# Patient Record
Sex: Female | Born: 1970 | Race: White | Hispanic: No | Marital: Married | Smoking: Never smoker
Health system: Southern US, Community
[De-identification: ages and names within clinical notes are randomized; demographics above are authoritative.]

## PROBLEM LIST (undated history)

## (undated) DIAGNOSIS — F988 Other specified behavioral and emotional disorders with onset usually occurring in childhood and adolescence: Secondary | ICD-10-CM

## (undated) DIAGNOSIS — F419 Anxiety disorder, unspecified: Secondary | ICD-10-CM

## (undated) DIAGNOSIS — R112 Nausea with vomiting, unspecified: Secondary | ICD-10-CM

## (undated) DIAGNOSIS — M797 Fibromyalgia: Secondary | ICD-10-CM

## (undated) DIAGNOSIS — T148XXA Other injury of unspecified body region, initial encounter: Secondary | ICD-10-CM

## (undated) DIAGNOSIS — F32A Depression, unspecified: Secondary | ICD-10-CM

## (undated) DIAGNOSIS — F329 Major depressive disorder, single episode, unspecified: Secondary | ICD-10-CM

## (undated) DIAGNOSIS — Z9889 Other specified postprocedural states: Secondary | ICD-10-CM

## (undated) HISTORY — DX: Depression, unspecified: F32.A

## (undated) HISTORY — PX: CHOLECYSTECTOMY: SHX55

## (undated) HISTORY — PX: WISDOM TOOTH EXTRACTION: SHX21

## (undated) HISTORY — DX: Anxiety disorder, unspecified: F41.9

## (undated) HISTORY — DX: Major depressive disorder, single episode, unspecified: F32.9

## (undated) HISTORY — DX: Other specified behavioral and emotional disorders with onset usually occurring in childhood and adolescence: F98.8

## (undated) HISTORY — DX: Other injury of unspecified body region, initial encounter: T14.8XXA

## (undated) HISTORY — DX: Fibromyalgia: M79.7

---

## 2004-05-25 ENCOUNTER — Ambulatory Visit (HOSPITAL_COMMUNITY): Admission: RE | Admit: 2004-05-25 | Discharge: 2004-05-25 | Payer: Self-pay | Admitting: Gynecology

## 2004-09-13 ENCOUNTER — Inpatient Hospital Stay (HOSPITAL_COMMUNITY): Admission: AD | Admit: 2004-09-13 | Discharge: 2004-09-13 | Payer: Self-pay | Admitting: Gynecology

## 2004-10-06 ENCOUNTER — Inpatient Hospital Stay (HOSPITAL_COMMUNITY): Admission: AD | Admit: 2004-10-06 | Discharge: 2004-10-09 | Payer: Self-pay | Admitting: Gynecology

## 2004-10-08 ENCOUNTER — Encounter (INDEPENDENT_AMBULATORY_CARE_PROVIDER_SITE_OTHER): Payer: Self-pay | Admitting: Specialist

## 2006-05-18 ENCOUNTER — Encounter: Payer: Self-pay | Admitting: Obstetrics & Gynecology

## 2006-05-18 ENCOUNTER — Ambulatory Visit: Payer: Self-pay | Admitting: Obstetrics & Gynecology

## 2006-07-03 ENCOUNTER — Ambulatory Visit: Payer: Self-pay | Admitting: Gynecology

## 2006-07-06 ENCOUNTER — Ambulatory Visit: Payer: Self-pay | Admitting: Gynecology

## 2007-08-21 ENCOUNTER — Ambulatory Visit: Payer: Self-pay | Admitting: Orthopedic Surgery

## 2007-12-06 ENCOUNTER — Encounter (INDEPENDENT_AMBULATORY_CARE_PROVIDER_SITE_OTHER): Payer: Self-pay | Admitting: Gynecology

## 2007-12-06 ENCOUNTER — Ambulatory Visit: Payer: Self-pay | Admitting: Gynecology

## 2009-05-04 ENCOUNTER — Ambulatory Visit: Payer: Self-pay | Admitting: Obstetrics and Gynecology

## 2009-05-19 ENCOUNTER — Ambulatory Visit: Payer: Self-pay | Admitting: Obstetrics & Gynecology

## 2009-06-02 ENCOUNTER — Ambulatory Visit: Payer: Self-pay | Admitting: Family Medicine

## 2009-09-23 ENCOUNTER — Ambulatory Visit: Payer: Self-pay | Admitting: Family Medicine

## 2009-09-23 LAB — CONVERTED CEMR LAB
FSH: 4.3 milliintl units/mL
LH: 16.7 milliintl units/mL

## 2009-11-19 ENCOUNTER — Ambulatory Visit (HOSPITAL_COMMUNITY): Admission: RE | Admit: 2009-11-19 | Discharge: 2009-11-19 | Payer: Self-pay | Admitting: Family Medicine

## 2009-11-19 ENCOUNTER — Ambulatory Visit: Payer: Self-pay | Admitting: Family Medicine

## 2009-12-01 ENCOUNTER — Ambulatory Visit: Payer: Self-pay | Admitting: Family Medicine

## 2010-03-14 HISTORY — PX: TUBAL LIGATION: SHX77

## 2010-04-28 ENCOUNTER — Emergency Department: Payer: Self-pay | Admitting: Emergency Medicine

## 2010-05-27 LAB — PREGNANCY, URINE: Preg Test, Ur: NEGATIVE

## 2010-05-28 LAB — CBC
HCT: 41.1 % (ref 36.0–46.0)
Hemoglobin: 14.2 g/dL (ref 12.0–15.0)
MCV: 94.1 fL (ref 78.0–100.0)
RBC: 4.37 MIL/uL (ref 3.87–5.11)
RDW: 13.2 % (ref 11.5–15.5)
WBC: 6.4 10*3/uL (ref 4.0–10.5)

## 2010-05-28 LAB — SURGICAL PCR SCREEN
MRSA, PCR: NEGATIVE
Staphylococcus aureus: NEGATIVE

## 2010-07-27 NOTE — Assessment & Plan Note (Signed)
NAMELILIAN, FUHS NO.:  [id number]   MEDICAL RECORD NO.:  [id number]          PATIENT TYPE:  POB   LOCATION:  [facility] at [facility]         FACILITY:  [facility]   PHYSICIAN:  Tinnie Gens, MD        DATE OF BIRTH:  13-Dec-1970   DATE OF SERVICE:  09/23/2009                                  CLINIC NOTE   CHIEF COMPLAINT:  Reschedule surgery.   HISTORY OF PRESENT ILLNESS:  This patient is a 40 year old gravida 3,  para 2-0-1-2 who has previously had an ultrasound that shows a complex  cystic mass on the left that was probably hydrosalpinx.  The patient  want a definitive treatment.  She was scheduled back in May for surgery,  however, this had to be cancelled because of mild change in schedule  related to colic having a baby.  She is here to reschedule this.  She is  otherwise without complaint.   PHYSICAL EXAMINATION:  VITAL SIGNS:  As noted in the chart.  GENERAL:  She is a well-developed, well-nourished female in no acute  distress.  ABDOMEN:  Soft, nontender, nondistended.   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   PLAN:  Laparoscopic left salpingectomy.  We will reschedule for the  first or second week of September.           ______________________________  Tinnie Gens, MD     TP/MEDQ  D:  09/23/2009  T:  09/24/2009  Job:  [id number]

## 2010-07-27 NOTE — Assessment & Plan Note (Signed)
NAMEEVOLETH, NORDMEYER NO.:  [id number]   MEDICAL RECORD NO.:  [id number]          PATIENT TYPE:  POB   LOCATION:  [facility] at [facility]         FACILITY:  [facility]   PHYSICIAN:  Ginger Carne, MD DATE OF BIRTH:  Dec 03, 1970   DATE OF SERVICE:                                  CLINIC NOTE   Ms. Piltz is a 40 year old multiparous female who is here today for  routine gynecologic evaluation.  She has had no significant changes in  her medical history since her last visit approximately 1 year ago.  The  patient has regular menses about every 28 days lasting 3-4 days.  She is  on Adderall 25 mg daily and Wellbutrin XL by her physicians in [place].  She has no specific GI, GU or cardiovascular complaints.   SALIENT PHYSICAL FINDINGS:  VITAL SIGNS:  Blood pressure 108/81, weight  164 pounds, height 5 feet 5 inches, pulse 87 and regular.  HEENT:  Grossly normal.  BREAST:  Without masses, discharge, thickenings, or tenderness.  CHEST:  Clear to percussion and auscultation.  CARDIOVASCULAR:  Without murmurs or enlargements.  Regular rate and  rhythm.  EXTREMITY, LYMPHATIC, SKIN, NEUROLOGICAL, AND MUSCULOSKELETAL SYSTEMS:  Within normal limits.  ABDOMEN:  Soft without gross hepatosplenomegaly.  PELVIC:  Pap smear performed.  External genitalia, vulva and vagina  normal.  Cervix smooth without erosions or lesions.  Uterus is small,  anteverted and flexed.  Both adnexa palpable and found to be normal.   IMPRESSION:  Normal gynecologic evaluation.   PLAN:  Recheck in 1 year.  No changes in family history.           ______________________________  Ginger Carne, MD     SHB/MEDQ  D:  12/06/2007  T:  12/06/2007  Job:  [id number]

## 2010-07-27 NOTE — Assessment & Plan Note (Signed)
NAMEMICHELINA, MEXICANO NO.:  [id number]   MEDICAL RECORD NO.:  [id number]           PATIENT TYPE:   LOCATION:  [facility]           FACILITY:   PHYSICIAN:  Scheryl Darter, MD       DATE OF BIRTH:  09/29/1970   DATE OF SERVICE:                                  CLINIC NOTE   The patient returns back for the results of her ultrasound that was done  on May 14, 2009.  She was having pain with intercourse on the left side  and recently she had severe cramps.  Prior to this, she had similar pain  about 2 years ago.  Ultrasound showed a 4.3 cm bilobed cystic mass with  solid component and Morison pouch to the left of the midline.  The  ovaries appeared normal.  The structure was inferior to the left adnexa.  The suggestion was to follow up with an MRI of the pelvis to further  evaluate the abnormality.  Discussed options of ordering this test  versus followup ultrasound in a few weeks of expectant management or  proceeding with exploratory surgery.  She requested that we followup  with her MRI and we will order this.  Ordered a CA-125 as well.  Most  likely, this is benign and it may be ovarian or tubal in origin.      Scheryl Darter, MD     JA/MEDQ  D:  05/19/2009  T:  05/20/2009  Job:  [id number]

## 2010-07-27 NOTE — Assessment & Plan Note (Signed)
NAMETAKARA, SERMONS NO.:  [id number]   MEDICAL RECORD NO.:  [id number]          PATIENT TYPE:  POB   LOCATION:  [facility] at [facility]         FACILITY:  [facility]   PHYSICIAN:  Tinnie Gens, MD        DATE OF BIRTH:  [birth year]   DATE OF SERVICE:  06/02/2009                                  CLINIC NOTE   CHIEF COMPLAINT:  Review MRI and surgical consult.   HISTORY OF PRESENT ILLNESS:  The patient is a 40 year old, gravida 3,  para 2-0-1-2, who was noted to have left lower quadrant pain.  She had  an ultrasound that showed some kind of weird complex cystic mass on the  left that was not associated with the ovary and may be with a more  __________ MRI scanning specifically revealed left hydrosalpinx.  She is  aware of this, and it does cause pain.  She would like to have it  definitively treated.  So, she is here today to be scheduled for that.   PAST MEDICAL HISTORY:  Significant for depression and anxiety.   PAST SURGICAL HISTORY:  She has had a tubal lap chole.   MEDICATION:  She is on Wellbutrin XL 1 p.o. daily.   ALLERGIES:  None known.  She is not allergic to latex.  She is not  allergic to food.   SOCIAL HISTORY:  She drinks 1-2 caffeinated beverages a day.  No  smoking, no alcohol, or drug use.   FAMILY HISTORY:  Diabetes, coronary artery disease, hypertension, and  cerebrovascular disease.   OBSTETRICAL HISTORY:  Two vaginal deliveries, one miscarriage.   GYNECOLOGIC HISTORY:  No history of abnormal Pap smears, tubal ligation.   PHYSICAL EXAMINATION:  VITAL SIGNS:  Vitals are as noted in the chart.  GENERAL:  She is a well-developed, well-nourished female, in no acute  distress.  ABDOMEN:  Soft, nontender, nondistended.  LUNGS:  Clear bilaterally.  CV:  Regular rate and rhythm without rubs, gallops, or murmurs.   IMPRESSION:  Probable left hydrosalpinx.   PLAN:  Laparoscopic removal of this tube.  Risks and benefits of this  procedure were  discussed with the patient.  She understands these risks,  __________ pain, but the risk of bleeding, infection, injury to  surrounding structures  given how many laparoscopies she has had in the past, injury to bowel  were discussed.  She will be scheduled once convenient for her which  will be the end of May.  Note, best date is the 26th.           ______________________________  Tinnie Gens, MD     TP/MEDQ  D:  06/02/2009  T:  06/03/2009  Job:  [id number]

## 2010-07-27 NOTE — Assessment & Plan Note (Signed)
NAME:  [name], [name] NO.:  [id number]   MEDICAL RECORD NO.:  [id number]          PATIENT TYPE:  POB   LOCATION:  [facility]         FACILITY:  [facility]   PHYSICIAN:  Argentina Donovan, MD        DATE OF BIRTH:  Dec 05, 1970   DATE OF SERVICE:  05/04/2009                                  CLINIC NOTE   The patient is a 40 year old white female in for her annual exam.  As a  present illness talks about the pain that she experienced a couple years  ago during coitus.  That recently occurred at the time of climax when  she had what felt like a continuous horrible mid suprapubic cramp that  lasted about 30 minutes.  She says occasionally she gets some left lower  quadrant pain returned with sexual activity, but goes away very quickly.  Other than that, she has almost no physical complaints.  She is on  Wellbutrin XL and Adderall and she stopped her Adderall herself and said  she was doing very well on it, but she got severe palpitations, so she  stopped it herself.  Apparently, she was not evaluated for anything at  that time, but to be apparently from stimulants gets this type of  palpitation.  I have told her she probably should talk about that with  her internist and suggested that he might be able to use that.  I have  her back on that medication and she was satisfied with that it.  She  used a little beta-blocker to prevent the palpitations, so she is going  to discuss that with him.  In addition on examination, we have decided  to get an ultrasound to see what may be causing her pelvic problem.   PHYSICAL EXAMINATION:  VITAL SIGNS:  Her blood pressure is 108/81 with a  pulse of 87, weight of 164 inches, and she is 5 feet 5 inches tall.  GENERAL:  Well-developed, well-nourished white female, in no acute  distress.  HEENT:  Within normal limits.  NECK:  Supple.  Thyroid symmetrical, no dominant masses.  BACK:  Erect.  LUNGS:  Clear to auscultation and percussion.   No CVA tenderness.  HEART:  No murmur.  Normal sinus rhythm.  BREASTS:  Symmetrical, large, and somewhat pendulous with no dominant  masses.  No nipple discharge.  No supraclavicular or axillary nodes.  ABDOMEN:  Soft, flat, nontender.  No masses or organomegaly.  EXTREMITIES:  No edema.  No varicosities.  NEUROLOGIC:  DTRs within normal limits.  GENITALIA:  External genitalia is normal.  BUS within normal limits.  The vagina is clean and well rugated.  Cervix is clean, parous, and she  has the first-degree uterine prolapse.  The uterus is anterior, but  there is a palpable ovary in the cul-de-sac which on motion elicits a  small amount of discomfort it cannot be stated what ovary this is and I  cannot palpate the other ovary.  RECTAL:  No masses.   IMPRESSION:  This lady has a first-degree uterine prolapse with a  prolapsed ovary in the cul-de-sac and I am thinking this may be related  to her  discomfort either by direct trauma or perhaps she could have had  an ovarian cyst when she had that 1 episode that may have leaked at that  point and given her the pain.  I am going to get an  ultrasound to see what that shows, have her come back in about 2 weeks  and meanwhile she is going to talk to her internist about possibly using  the beta-blocker along with her Adderall.            ______________________________  Argentina Donovan, MD     PR/MEDQ  D:  05/04/2009  T:  05/05/2009  Job:  [id number]

## 2010-07-27 NOTE — Assessment & Plan Note (Signed)
NAMETIFANY, HIRSCH NO.:  [id number]   MEDICAL RECORD NO.:  [id number]          PATIENT TYPE:  POB   LOCATION:  [facility] at [facility]         FACILITY:  [facility]   PHYSICIAN:  Tinnie Gens, MD        DATE OF BIRTH:  [birth year]   DATE OF SERVICE:  12/01/2009                                  CLINIC NOTE   CHIEF COMPLAINT:  Postop check.   HISTORY OF PRESENT ILLNESS:  The patient is a 40 year old gravida 3,  para 2-0-1-2 who had undergone a laparoscopic removal of a left  hydrosalpinx.  She reports feeling much better.  The pain has completely  gone.  She is interested in restarting her Lyrica as fast as she can.   On exam, vitals are as noted in the chart.  She is a well-developed,  well-nourished female in no acute distress.  Abdomen is soft, nontender,  and nondistended.  Incisions are well healed.   IMPRESSION:  Status post laparoscopic salpingectomy for hydrosalpinx.   PLAN:  May return to reasonable activity.  Followup as needed for yearly  exam.  Pictures of her surgery were reviewed with her on today's visit.           ______________________________  Tinnie Gens, MD     TP/MEDQ  D:  12/01/2009  T:  12/02/2009  Job:  [id number]

## 2010-07-30 NOTE — Assessment & Plan Note (Signed)
NAMECRISTABEL, [name] NO.:  [id number]   MEDICAL RECORD NO.:  [id number]          PATIENT TYPE:  POB   LOCATION:  [facility] at [facility]         FACILITY:  [facility]   PHYSICIAN:  Elsie Lincoln, MD      DATE OF BIRTH:  [birth year]   DATE OF SERVICE:  05/18/2006                                  CLINIC NOTE   Patient is a 40 year old G3, para 2-0-1-2 female with last menstrual  period unknown.  She does have regular periods lasting 21 days, and her  periods last 7, medium flow with moderate pain.  She has been having  staining between periods for the past 3-4 months.  She has no other  complaints at this time.   OBSTETRICAL HISTORY:  She has had 2 vaginal deliveries and 1  miscarriage.   GYN HISTORY:  Pap smear:  Last Pap smear was approximately a year and a  half ago, and has had no abnormal Pap smears.  No history of ovarian  cysts, fibroid tumors, or sexually transmitted diseases.  Has never had  a mammogram.  She uses BTL for contraception.   SURGERIES:  Hemorrhages, gallbladder removal , it was gangrenous, and  BTL.   FAMILY HISTORY:  Father and grandmother with diabetes.  Father, heart  attack.  Mother and father with high blood pressure.  Maternal  grandfather had a stroke and died.   PAST MEDICAL HISTORY:  Does have depression and anxiety.   SOCIAL HISTORY:  Drinks 1-2 caffeinated beverages a day, no smoking,  drinking, or drugs.   REVIEW OF SYSTEMS:  She is fatigued but has a 40-month-old baby and  attributes it to this.  She has dizzy spells secondary to her Adderall,  and she has ringing in her ears that has gone on for a long time.  She  had recent upper respiratory illnesses, this contributed to cough and  phlegm.   MEDICATIONS:  1. Adderall.  2. Wellbutrin.   GENERAL:  Well-nourished, well-developed, no apparent distress.  Blood pressure 115/78, pulse 71, weight 165.  HEENT:  Normocephalic, atraumatic.  THYROID:  No masses.  LUNGS:  Clear to  auscultation bilaterally.  HEART:  Regular rate and rhythm.  BREASTS:  Slightly increased density of tissue at 6 o'clock on the right  breast, and I believe it is probably just the contour of her breast,  however, it is more than I would have expected, so I am going to get an  ultrasound and mammogram.  There is no lymphadenopathy.  ABDOMEN:  Soft, nontender, no hernia, no organomegaly.  GENITALIA:  Tanner 5.  Vagina pink, normal rugae.  Cervix closed,  nontender.  Uterus, nontender.  Adnexa, no masses, nontender.  Urethra  and bladder, nontender while supported, no rectocele.  RECTUM:  Intact.  EXTREMITIES:  Nontender, no edema.   ASSESSMENT AND PLAN:  50. A 40 year old female for Pap smear.  Pap smear and cultures done.  2. Right breast, 6 o'clock increased density, possible mass.  Will get      ultrasound and mammogram.  3. Intramenstrual bleeding for 4 months.  Will do endometrial biopsy      in 2 weeks.  4. TSH  next visit for fatigue.  Return to clinic in 2 weeks.           ______________________________  Elsie Lincoln, MD     KL/MEDQ  D:  05/18/2006  T:  05/18/2006  Job:  [id number]

## 2010-07-30 NOTE — Op Note (Signed)
[name], [name]             ACCOUNT NO.:  [id number]   MEDICAL RECORD NO.:  [id number]          PATIENT TYPE:  INP   LOCATION:  9139                          FACILITY:  [facility]   PHYSICIAN:  Ginger Carne, MD  DATE OF BIRTH:  February 24, 1971   DATE OF PROCEDURE:  10/08/2004  DATE OF DISCHARGE:                                 OPERATIVE REPORT   PREOPERATIVE DIAGNOSIS:  Request for permanent sterilization.   POSTOPERATIVE DIAGNOSIS:  Request for permanent sterilization.   PROCEDURE:  P[place]oy bilateral postpartum tubal ligation.   SURGEON:  Ginger Carne, M.D.   ASSISTANT:  None.   COMPLICATIONS:  None immediate.   ESTIMATED BLOOD LOSS:  Minimal.   ANESTHESIA:  Epidural topoff.   SPECIMEN:  Right and left tubes and ovaries.   OPERATIVE FINDINGS:  Uterus, tubes and ovaries showed normal decidual  changes of pregnancy. Both tubes bilaterally were identified from their  isthmus to fimbriated end separate and apart from the respective round  ligaments.   OPERATIVE PROCEDURE:  The patient prepped and draped in usual fashion and  placed in the supine position. Betadine solution used for antiseptic and the  patient was catheterized prior to the procedure. After adequate epidural top  off a small vertical infraumbilical incision was made and the abdomen  opened. Both tubes were once again identified from their isthmus to  fimbriated end. 2 cm of tube on either side were incorporated with a 2-0  plain catgut suture twice. Tubes cut above said knots and the tips  cauterized. No active bleeding noted. Closure of the parietal peritoneum and  fascia in one layer with 0 Vicryl suture and 4-0 Vicryl for subcuticular  closure. Instrument and sponge count were correct. The patient tolerated the  procedure well and returned to post anesthesia recovery room in excellent  condition.       SHB/MEDQ  D:  10/08/2004  T:  10/08/2004  Job:  [id number]

## 2011-12-21 ENCOUNTER — Ambulatory Visit: Payer: Self-pay | Admitting: Obstetrics & Gynecology

## 2013-01-26 ENCOUNTER — Ambulatory Visit: Payer: Self-pay | Admitting: Family Medicine

## 2013-01-26 LAB — URINALYSIS, COMPLETE
Bilirubin,UR: NEGATIVE
Glucose,UR: NEGATIVE mg/dL (ref 0–75)
Ph: 8.5 (ref 4.5–8.0)

## 2013-01-28 LAB — URINE CULTURE

## 2013-04-12 ENCOUNTER — Ambulatory Visit: Payer: Self-pay | Admitting: Physician Assistant

## 2013-04-17 ENCOUNTER — Encounter: Payer: Self-pay | Admitting: Family Medicine

## 2013-04-17 ENCOUNTER — Ambulatory Visit (INDEPENDENT_AMBULATORY_CARE_PROVIDER_SITE_OTHER): Payer: Private Health Insurance - Indemnity | Admitting: Family Medicine

## 2013-04-17 VITALS — BP 125/79 | HR 79 | Ht 64.0 in | Wt 219.4 lb

## 2013-04-17 DIAGNOSIS — F3289 Other specified depressive episodes: Secondary | ICD-10-CM

## 2013-04-17 DIAGNOSIS — F329 Major depressive disorder, single episode, unspecified: Secondary | ICD-10-CM

## 2013-04-17 DIAGNOSIS — F32A Depression, unspecified: Secondary | ICD-10-CM | POA: Insufficient documentation

## 2013-04-17 DIAGNOSIS — Z1151 Encounter for screening for human papillomavirus (HPV): Secondary | ICD-10-CM

## 2013-04-17 DIAGNOSIS — Z1239 Encounter for other screening for malignant neoplasm of breast: Secondary | ICD-10-CM

## 2013-04-17 DIAGNOSIS — E669 Obesity, unspecified: Secondary | ICD-10-CM

## 2013-04-17 DIAGNOSIS — F988 Other specified behavioral and emotional disorders with onset usually occurring in childhood and adolescence: Secondary | ICD-10-CM

## 2013-04-17 DIAGNOSIS — Z01419 Encounter for gynecological examination (general) (routine) without abnormal findings: Secondary | ICD-10-CM

## 2013-04-17 DIAGNOSIS — Z124 Encounter for screening for malignant neoplasm of cervix: Secondary | ICD-10-CM

## 2013-04-17 NOTE — Progress Notes (Signed)
  Subjective:     Carol DauerJennifer H Townsend is a 43 y.o. female and is here for a comprehensive physical exam. The patient reports no problems. Recently finished degree in fine arts.  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  Had to come off her Adderall and has gained some significant weight.   History   Social History  . Marital Status: Married    Spouse Name: N/A    Number of Children: N/A  . Years of Education: N/A   Occupational History  . Not on file.   Social History Main Topics  . Smoking status: Never Smoker   . Smokeless tobacco: Never Used  . Alcohol Use: Yes     Comment: rare  . Drug Use: Not on file  . Sexual Activity: Yes    Partners: Male    Birth Control/ Protection: Surgical   Other Topics Concern  . Not on file   Social History Narrative  . No narrative on file   Health Maintenance  Topic Date Due  . Pap Smear  04/22/1988  . Tetanus/tdap  04/22/1989  . Influenza Vaccine  05/17/2013    The following portions of the patient's history were reviewed and updated as appropriate: allergies, current medications, past family history, past medical history, past social history, past surgical history and problem list.  Review of Systems A comprehensive review of systems was negative.   Objective:    BP 125/79  Pulse 79  Ht 5\' 4"  (1.626 m)  Wt 219 lb 6.4 oz (99.519 kg)  BMI 37.64 kg/m2  LMP 04/03/2013 General appearance: alert, cooperative, appears stated age and moderately obese Head: Normocephalic, without obvious abnormality, atraumatic Neck: no adenopathy, supple, symmetrical, trachea midline and thyroid not enlarged, symmetric, no tenderness/mass/nodules Lungs: clear to auscultation bilaterally Breasts: normal appearance, no masses or tenderness Heart: regular rate and rhythm, S1, S2 normal, no murmur, click, rub or gallop Abdomen: soft, non-tender; bowel sounds normal; no masses,  no organomegaly Pelvic: cervix normal in appearance,  external genitalia normal, no adnexal masses or tenderness, no cervical motion tenderness, uterus normal size, shape, and consistency and vagina normal without discharge Extremities: extremities normal, atraumatic, no cyanosis or edema Pulses: 2+ and symmetric Skin: Skin color, texture, turgor normal. No rashes or lesions Lymph nodes: Cervical, supraclavicular, and axillary nodes normal. Neurologic: Alert and oriented X 3, normal strength and tone. Normal symmetric reflexes. Normal coordination and gait    Assessment:    Healthy female exam.  Regular cycles.  S/p BTL     Plan:    Pap smear Schedule mammogram PCP for flu and annual blood work See After Visit Summary for Counseling Recommendations

## 2013-04-17 NOTE — Patient Instructions (Signed)

## 2013-04-22 ENCOUNTER — Telehealth: Payer: Self-pay | Admitting: *Deleted

## 2013-04-22 NOTE — Telephone Encounter (Signed)
Message copied by Barbara CowerNOGUES, Lucine Bilski L on Mon Apr 22, 2013 10:07 AM ------      Message from: Reva BoresPRATT, TANYA S      Created: Mon Apr 22, 2013  9:13 AM       Abnormal cell on pap but neg HPV.  please inform pt.--routine screening for pap ------

## 2013-04-22 NOTE — Telephone Encounter (Signed)
Spoke to patient regarding test results she will follow up in one year as reccomended.

## 2013-04-24 ENCOUNTER — Ambulatory Visit: Payer: Self-pay | Admitting: Family Medicine

## 2014-01-13 ENCOUNTER — Encounter: Payer: Self-pay | Admitting: Family Medicine

## 2014-05-07 ENCOUNTER — Ambulatory Visit (INDEPENDENT_AMBULATORY_CARE_PROVIDER_SITE_OTHER): Payer: Private Health Insurance - Indemnity | Admitting: Family Medicine

## 2014-05-07 ENCOUNTER — Encounter: Payer: Self-pay | Admitting: Family Medicine

## 2014-05-07 VITALS — BP 130/83 | HR 80 | Ht 64.0 in | Wt 219.0 lb

## 2014-05-07 DIAGNOSIS — Z01419 Encounter for gynecological examination (general) (routine) without abnormal findings: Secondary | ICD-10-CM

## 2014-05-07 DIAGNOSIS — Z1151 Encounter for screening for human papillomavirus (HPV): Secondary | ICD-10-CM

## 2014-05-07 DIAGNOSIS — Z124 Encounter for screening for malignant neoplasm of cervix: Secondary | ICD-10-CM

## 2014-05-07 NOTE — Patient Instructions (Signed)
Perimenopause Perimenopause is the time when your body begins to move into the menopause (no menstrual period for 12 straight months). It is a natural process. Perimenopause can begin 2-8 years before the menopause and usually lasts for 1 year after the menopause. During this time, your ovaries may or may not produce an egg. The ovaries vary in their production of estrogen and progesterone hormones each month. This can cause irregular menstrual periods, difficulty getting pregnant, vaginal bleeding between periods, and uncomfortable symptoms. CAUSES  Irregular production of the ovarian hormones, estrogen and progesterone, and not ovulating every month.  Other causes include:  Tumor of the pituitary gland in the brain.  Medical disease that affects the ovaries.  Radiation treatment.  Chemotherapy.  Unknown causes.  Heavy smoking and excessive alcohol intake can bring on perimenopause sooner. SIGNS AND SYMPTOMS   Hot flashes.  Night sweats.  Irregular menstrual periods.  Decreased sex drive.  Vaginal dryness.  Headaches.  Mood swings.  Depression.  Memory problems.  Irritability.  Tiredness.  Weight gain.  Trouble getting pregnant.  The beginning of losing bone cells (osteoporosis).  The beginning of hardening of the arteries (atherosclerosis). DIAGNOSIS  Your health care provider will make a diagnosis by analyzing your age, menstrual history, and symptoms. He or she will do a physical exam and note any changes in your body, especially your female organs. Female hormone tests may or may not be helpful depending on the amount of female hormones you produce and when you produce them. However, other hormone tests may be helpful to rule out other problems. TREATMENT  In some cases, no treatment is needed. The decision on whether treatment is necessary during the perimenopause should be made by you and your health care provider based on how the symptoms are affecting you  and your lifestyle. Various treatments are available, such as:  Treating individual symptoms with a specific medicine for that symptom.  Herbal medicines that can help specific symptoms.  Counseling.  Group therapy. HOME CARE INSTRUCTIONS   Keep track of your menstrual periods (when they occur, how heavy they are, how long between periods, and how long they last) as well as your symptoms and when they started.  Only take over-the-counter or prescription medicines as directed by your health care provider.  Sleep and rest.  Exercise.  Eat a diet that contains calcium (good for your bones) and soy (acts like the estrogen hormone).  Do not smoke.  Avoid alcoholic beverages.  Take vitamin supplements as recommended by your health care provider. Taking vitamin E may help in certain cases.  Take calcium and vitamin D supplements to help prevent bone loss.  Group therapy is sometimes helpful.  Acupuncture may help in some cases. SEEK MEDICAL CARE IF:   You have questions about any symptoms you are having.  You need a referral to a specialist (gynecologist, psychiatrist, or psychologist). SEEK IMMEDIATE MEDICAL CARE IF:   You have vaginal bleeding.  Your period lasts longer than 8 days.  Your periods are recurring sooner than 21 days.  You have bleeding after intercourse.  You have severe depression.  You have pain when you urinate.  You have severe headaches.  You have vision problems. Document Released: 04/07/2004 Document Revised: 12/19/2012 Document Reviewed: 09/27/2012 [facility] Patient Information 2015 [place], [place]. This information is not intended to replace advice given to you by your health care provider. Make sure you discuss any questions you have with your health care provider. Preventive Care for Adults A  healthy lifestyle and preventive care can promote health and wellness. Preventive health guidelines for women include the following key practices.  A  routine yearly physical is a good way to check with your health care provider about your health and preventive screening. It is a chance to share any concerns and updates on your health and to receive a thorough exam.  Visit your dentist for a routine exam and preventive care every 6 months. Brush your teeth twice a day and floss once a day. Good oral hygiene prevents tooth decay and gum disease.  The frequency of eye exams is based on your age, health, family medical history, use of contact lenses, and other factors. Follow your health care provider's recommendations for frequency of eye exams.  Eat a healthy diet. Foods like vegetables, fruits, whole grains, low-fat dairy products, and lean protein foods contain the nutrients you need without too many calories. Decrease your intake of foods high in solid fats, added sugars, and salt. Eat the right amount of calories for you.Get information about a proper diet from your health care provider, if necessary.  Regular physical exercise is one of the most important things you can do for your health. Most adults should get at least 150 minutes of moderate-intensity exercise (any activity that increases your heart rate and causes you to sweat) each week. In addition, most adults need muscle-strengthening exercises on 2 or more days a week.  Maintain a healthy weight. The body mass index (BMI) is a screening tool to identify possible weight problems. It provides an estimate of body fat based on height and weight. Your health care provider can find your BMI and can help you achieve or maintain a healthy weight.For adults 20 years and older:  A BMI below 18.5 is considered underweight.  A BMI of 18.5 to 24.9 is normal.  A BMI of 25 to 29.9 is considered overweight.  A BMI of 30 and above is considered obese.  Maintain normal blood lipids and cholesterol levels by exercising and minimizing your intake of saturated fat. Eat a balanced diet with plenty of  fruit and vegetables. Blood tests for lipids and cholesterol should begin at age 66 and be repeated every 5 years. If your lipid or cholesterol levels are high, you are over 50, or you are at high risk for heart disease, you may need your cholesterol levels checked more frequently.Ongoing high lipid and cholesterol levels should be treated with medicines if diet and exercise are not working.  If you smoke, find out from your health care provider how to quit. If you do not use tobacco, do not start.  Lung cancer screening is recommended for adults aged 18-80 years who are at high risk for developing lung cancer because of a history of smoking. A yearly low-dose CT scan of the lungs is recommended for people who have at least a 30-pack-year history of smoking and are a current smoker or have quit within the past 15 years. A pack year of smoking is smoking an average of 1 pack of cigarettes a day for 1 year (for example: 1 pack a day for 30 years or 2 packs a day for 15 years). Yearly screening should continue until the smoker has stopped smoking for at least 15 years. Yearly screening should be stopped for people who develop a health problem that would prevent them from having lung cancer treatment.  If you are pregnant, do not drink alcohol. If you are breastfeeding, be very cautious about  drinking alcohol. If you are not pregnant and choose to drink alcohol, do not have more than 1 drink per day. One drink is considered to be 12 ounces (355 mL) of beer, 5 ounces (148 mL) of wine, or 1.5 ounces (44 mL) of liquor.  Avoid use of street drugs. Do not share needles with anyone. Ask for help if you need support or instructions about stopping the use of drugs.  High blood pressure causes heart disease and increases the risk of stroke. Your blood pressure should be checked at least every 1 to 2 years. Ongoing high blood pressure should be treated with medicines if weight loss and exercise do not work.  If you  are 31-4 years old, ask your health care provider if you should take aspirin to prevent strokes.  Diabetes screening involves taking a blood sample to check your fasting blood sugar level. This should be done once every 3 years, after age 54, if you are within normal weight and without risk factors for diabetes. Testing should be considered at a younger age or be carried out more frequently if you are overweight and have at least 1 risk factor for diabetes.  Breast cancer screening is essential preventive care for women. You should practice "breast self-awareness." This means understanding the normal appearance and feel of your breasts and may include breast self-examination. Any changes detected, no matter how small, should be reported to a health care provider. Women in their 41s and 30s should have a clinical breast exam (CBE) by a health care provider as part of a regular health exam every 1 to 3 years. After age 57, women should have a CBE every year. Starting at age 63, women should consider having a mammogram (breast X-ray test) every year. Women who have a family history of breast cancer should talk to their health care provider about genetic screening. Women at a high risk of breast cancer should talk to their health care providers about having an MRI and a mammogram every year.  Breast cancer gene (BRCA)-related cancer risk assessment is recommended for women who have family members with BRCA-related cancers. BRCA-related cancers include breast, ovarian, tubal, and peritoneal cancers. Having family members with these cancers may be associated with an increased risk for harmful changes (mutations) in the breast cancer genes BRCA1 and BRCA2. Results of the assessment will determine the need for genetic counseling and BRCA1 and BRCA2 testing.  Routine pelvic exams to screen for cancer are no longer recommended for nonpregnant women who are considered low risk for cancer of the pelvic organs (ovaries,  uterus, and vagina) and who do not have symptoms. Ask your health care provider if a screening pelvic exam is right for you.  If you have had past treatment for cervical cancer or a condition that could lead to cancer, you need Pap tests and screening for cancer for at least 20 years after your treatment. If Pap tests have been discontinued, your risk factors (such as having a new sexual partner) need to be reassessed to determine if screening should be resumed. Some women have medical problems that increase the chance of getting cervical cancer. In these cases, your health care provider may recommend more frequent screening and Pap tests.  The HPV test is an additional test that may be used for cervical cancer screening. The HPV test looks for the virus that can cause the cell changes on the cervix. The cells collected during the Pap test can be tested for HPV. The  HPV test could be used to screen women aged 76 years and older, and should be used in women of any age who have unclear Pap test results. After the age of 91, women should have HPV testing at the same frequency as a Pap test.  Colorectal cancer can be detected and often prevented. Most routine colorectal cancer screening begins at the age of 51 years and continues through age 36 years. However, your health care provider may recommend screening at an earlier age if you have risk factors for colon cancer. On a yearly basis, your health care provider may provide home test kits to check for hidden blood in the stool. Use of a small camera at the end of a tube, to directly examine the colon (sigmoidoscopy or colonoscopy), can detect the earliest forms of colorectal cancer. Talk to your health care provider about this at age 62, when routine screening begins. Direct exam of the colon should be repeated every 5-10 years through age 69 years, unless early forms of pre-cancerous polyps or small growths are found.  People who are at an increased risk for  hepatitis B should be screened for this virus. You are considered at high risk for hepatitis B if:  You were born in a country where hepatitis B occurs often. Talk with your health care provider about which countries are considered high risk.  Your parents were born in a high-risk country and you have not received a shot to protect against hepatitis B (hepatitis B vaccine).  You have HIV or AIDS.  You use needles to inject street drugs.  You live with, or have sex with, someone who has hepatitis B.  You get hemodialysis treatment.  You take certain medicines for conditions like cancer, organ transplantation, and autoimmune conditions.  Hepatitis C blood testing is recommended for all people born from [birth year] through 1965 and any individual with known risks for hepatitis C.  Practice safe sex. Use condoms and avoid high-risk sexual practices to reduce the spread of sexually transmitted infections (STIs). STIs include gonorrhea, chlamydia, syphilis, trichomonas, herpes, HPV, and human immunodeficiency virus (HIV). Herpes, HIV, and HPV are viral illnesses that have no cure. They can result in disability, cancer, and death.  You should be screened for sexually transmitted illnesses (STIs) including gonorrhea and chlamydia if:  You are sexually active and are younger than 24 years.  You are older than 24 years and your health care provider tells you that you are at risk for this type of infection.  Your sexual activity has changed since you were last screened and you are at an increased risk for chlamydia or gonorrhea. Ask your health care provider if you are at risk.  If you are at risk of being infected with HIV, it is recommended that you take a prescription medicine daily to prevent HIV infection. This is called preexposure prophylaxis (PrEP). You are considered at risk if:  You are a heterosexual woman, are sexually active, and are at increased risk for HIV infection.  You take drugs by  injection.  You are sexually active with a partner who has HIV.  Talk with your health care provider about whether you are at high risk of being infected with HIV. If you choose to begin PrEP, you should first be tested for HIV. You should then be tested every 3 months for as long as you are taking PrEP.  Osteoporosis is a disease in which the bones lose minerals and strength with aging. This can  result in serious bone fractures or breaks. The risk of osteoporosis can be identified using a bone density scan. Women ages 48 years and over and women at risk for fractures or osteoporosis should discuss screening with their health care providers. Ask your health care provider whether you should take a calcium supplement or vitamin D to reduce the rate of osteoporosis.  Menopause can be associated with physical symptoms and risks. Hormone replacement therapy is available to decrease symptoms and risks. You should talk to your health care provider about whether hormone replacement therapy is right for you.  Use sunscreen. Apply sunscreen liberally and repeatedly throughout the day. You should seek shade when your shadow is shorter than you. Protect yourself by wearing long sleeves, pants, a wide-brimmed hat, and sunglasses year round, whenever you are outdoors.  Once a month, do a whole body skin exam, using a mirror to look at the skin on your back. Tell your health care provider of new moles, moles that have irregular borders, moles that are larger than a pencil eraser, or moles that have changed in shape or color.  Stay current with required vaccines (immunizations).  Influenza vaccine. All adults should be immunized every year.  Tetanus, diphtheria, and acellular pertussis (Td, Tdap) vaccine. Pregnant women should receive 1 dose of Tdap vaccine during each pregnancy. The dose should be obtained regardless of the length of time since the last dose. Immunization is preferred during the 27th-36th week of  gestation. An adult who has not previously received Tdap or who does not know her vaccine status should receive 1 dose of Tdap. This initial dose should be followed by tetanus and diphtheria toxoids (Td) booster doses every 10 years. Adults with an unknown or incomplete history of completing a 3-dose immunization series with Td-containing vaccines should begin or complete a primary immunization series including a Tdap dose. Adults should receive a Td booster every 10 years.  Varicella vaccine. An adult without evidence of immunity to varicella should receive 2 doses or a second dose if she has previously received 1 dose. Pregnant females who do not have evidence of immunity should receive the first dose after pregnancy. This first dose should be obtained before leaving the health care facility. The second dose should be obtained 4-8 weeks after the first dose.  Human papillomavirus (HPV) vaccine. Females aged 13-26 years who have not received the vaccine previously should obtain the 3-dose series. The vaccine is not recommended for use in pregnant females. However, pregnancy testing is not needed before receiving a dose. If a female is found to be pregnant after receiving a dose, no treatment is needed. In that case, the remaining doses should be delayed until after the pregnancy. Immunization is recommended for any person with an immunocompromised condition through the age of 28 years if she did not get any or all doses earlier. During the 3-dose series, the second dose should be obtained 4-8 weeks after the first dose. The third dose should be obtained 24 weeks after the first dose and 16 weeks after the second dose.  Zoster vaccine. One dose is recommended for adults aged 75 years or older unless certain conditions are present.  Measles, mumps, and rubella (MMR) vaccine. Adults born before [birth year] generally are considered immune to measles and mumps. Adults born in [birth year] or later should have 1 or more doses  of MMR vaccine unless there is a contraindication to the vaccine or there is laboratory evidence of immunity to each of the three  diseases. A routine second dose of MMR vaccine should be obtained at least 28 days after the first dose for students attending postsecondary schools, health care workers, or international travelers. People who received inactivated measles vaccine or an unknown type of measles vaccine during 1963-1967 should receive 2 doses of MMR vaccine. People who received inactivated mumps vaccine or an unknown type of mumps vaccine before 1979 and are at high risk for mumps infection should consider immunization with 2 doses of MMR vaccine. For females of childbearing age, rubella immunity should be determined. If there is no evidence of immunity, females who are not pregnant should be vaccinated. If there is no evidence of immunity, females who are pregnant should delay immunization until after pregnancy. Unvaccinated health care workers born before [birth year] who lack laboratory evidence of measles, mumps, or rubella immunity or laboratory confirmation of disease should consider measles and mumps immunization with 2 doses of MMR vaccine or rubella immunization with 1 dose of MMR vaccine.  Pneumococcal 13-valent conjugate (PCV13) vaccine. When indicated, a person who is uncertain of her immunization history and has no record of immunization should receive the PCV13 vaccine. An adult aged 44 years or older who has certain medical conditions and has not been previously immunized should receive 1 dose of PCV13 vaccine. This PCV13 should be followed with a dose of pneumococcal polysaccharide (PPSV23) vaccine. The PPSV23 vaccine dose should be obtained at least 8 weeks after the dose of PCV13 vaccine. An adult aged 72 years or older who has certain medical conditions and previously received 1 or more doses of PPSV23 vaccine should receive 1 dose of PCV13. The PCV13 vaccine dose should be obtained 1 or more  years after the last PPSV23 vaccine dose.  Pneumococcal polysaccharide (PPSV23) vaccine. When PCV13 is also indicated, PCV13 should be obtained first. All adults aged 21 years and older should be immunized. An adult younger than age 10 years who has certain medical conditions should be immunized. Any person who resides in a nursing home or long-term care facility should be immunized. An adult smoker should be immunized. People with an immunocompromised condition and certain other conditions should receive both PCV13 and PPSV23 vaccines. People with human immunodeficiency virus (HIV) infection should be immunized as soon as possible after diagnosis. Immunization during chemotherapy or radiation therapy should be avoided. Routine use of PPSV23 vaccine is not recommended for American Indians, [place] Natives, or people younger than 65 years unless there are medical conditions that require PPSV23 vaccine. When indicated, people who have unknown immunization and have no record of immunization should receive PPSV23 vaccine. One-time revaccination 5 years after the first dose of PPSV23 is recommended for people aged 19-64 years who have chronic kidney failure, nephrotic syndrome, asplenia, or immunocompromised conditions. People who received 1-2 doses of PPSV23 before age 87 years should receive another dose of PPSV23 vaccine at age 60 years or later if at least 5 years have passed since the previous dose. Doses of PPSV23 are not needed for people immunized with PPSV23 at or after age 32 years.  Meningococcal vaccine. Adults with asplenia or persistent complement component deficiencies should receive 2 doses of quadrivalent meningococcal conjugate (MenACWY-D) vaccine. The doses should be obtained at least 2 months apart. Microbiologists working with certain meningococcal bacteria, [employer] recruits, people at risk during an outbreak, and people who travel to or live in countries with a high rate of meningitis should be  immunized. A first-year college student up through age 64 years who is  living in a residence hall should receive a dose if she did not receive a dose on or after her 16th birthday. Adults who have certain high-risk conditions should receive one or more doses of vaccine.  Hepatitis A vaccine. Adults who wish to be protected from this disease, have certain high-risk conditions, work with hepatitis A-infected animals, work in hepatitis A research labs, or travel to or work in countries with a high rate of hepatitis A should be immunized. Adults who were previously unvaccinated and who anticipate close contact with an international adoptee during the first 60 days after arrival in the [place] from a country with a high rate of hepatitis A should be immunized.  Hepatitis B vaccine. Adults who wish to be protected from this disease, have certain high-risk conditions, may be exposed to blood or other infectious body fluids, are household contacts or sex partners of hepatitis B positive people, are clients or workers in certain care facilities, or travel to or work in countries with a high rate of hepatitis B should be immunized.  Haemophilus influenzae type b (Hib) vaccine. A previously unvaccinated person with asplenia or sickle cell disease or having a scheduled splenectomy should receive 1 dose of Hib vaccine. Regardless of previous immunization, a recipient of a hematopoietic stem cell transplant should receive a 3-dose series 6-12 months after her successful transplant. Hib vaccine is not recommended for adults with HIV infection. Preventive Services / Frequency Ages 34 to 33 years  Blood pressure check.** / Every 1 to 2 years.  Lipid and cholesterol check.** / Every 5 years beginning at age 56.  Clinical breast exam.** / Every 3 years for women in their 39s and 6s.  BRCA-related cancer risk assessment.** / For women who have family members with a BRCA-related cancer (breast, ovarian, tubal, or  peritoneal cancers).  Pap test.** / Every 2 years from ages 54 through 76. Every 3 years starting at age 2 through age 72 or 71 with a history of 3 consecutive normal Pap tests.  HPV screening.** / Every 3 years from ages 33 through ages 80 to 24 with a history of 3 consecutive normal Pap tests.  Hepatitis C blood test.** / For any individual with known risks for hepatitis C.  Skin self-exam. / Monthly.  Influenza vaccine. / Every year.  Tetanus, diphtheria, and acellular pertussis (Tdap, Td) vaccine.** / Consult your health care provider. Pregnant women should receive 1 dose of Tdap vaccine during each pregnancy. 1 dose of Td every 10 years.  Varicella vaccine.** / Consult your health care provider. Pregnant females who do not have evidence of immunity should receive the first dose after pregnancy.  HPV vaccine. / 3 doses over 6 months, if 67 and younger. The vaccine is not recommended for use in pregnant females. However, pregnancy testing is not needed before receiving a dose.  Measles, mumps, rubella (MMR) vaccine.** / You need at least 1 dose of MMR if you were born in 1957 or later. You may also need a 2nd dose. For females of childbearing age, rubella immunity should be determined. If there is no evidence of immunity, females who are not pregnant should be vaccinated. If there is no evidence of immunity, females who are pregnant should delay immunization until after pregnancy.  Pneumococcal 13-valent conjugate (PCV13) vaccine.** / Consult your health care provider.  Pneumococcal polysaccharide (PPSV23) vaccine.** / 1 to 2 doses if you smoke cigarettes or if you have certain conditions.  Meningococcal vaccine.** / 1 dose if  you are age 74 to 38 years and a Market researcher living in a residence hall, or have one of several medical conditions, you need to get vaccinated against meningococcal disease. You may also need additional booster doses.  Hepatitis A vaccine.** /  Consult your health care provider.  Hepatitis B vaccine.** / Consult your health care provider.  Haemophilus influenzae type b (Hib) vaccine.** / Consult your health care provider. Ages 67 to 67 years  Blood pressure check.** / Every 1 to 2 years.  Lipid and cholesterol check.** / Every 5 years beginning at age 80 years.  Lung cancer screening. / Every year if you are aged 42-80 years and have a 30-pack-year history of smoking and currently smoke or have quit within the past 15 years. Yearly screening is stopped once you have quit smoking for at least 15 years or develop a health problem that would prevent you from having lung cancer treatment.  Clinical breast exam.** / Every year after age 31 years.  BRCA-related cancer risk assessment.** / For women who have family members with a BRCA-related cancer (breast, ovarian, tubal, or peritoneal cancers).  Mammogram.** / Every year beginning at age 62 years and continuing for as long as you are in good health. Consult with your health care provider.  Pap test.** / Every 3 years starting at age 75 years through age 2 or 68 years with a history of 3 consecutive normal Pap tests.  HPV screening.** / Every 3 years from ages 22 years through ages 3 to 10 years with a history of 3 consecutive normal Pap tests.  Fecal occult blood test (FOBT) of stool. / Every year beginning at age 36 years and continuing until age 5 years. You may not need to do this test if you get a colonoscopy every 10 years.  Flexible sigmoidoscopy or colonoscopy.** / Every 5 years for a flexible sigmoidoscopy or every 10 years for a colonoscopy beginning at age 6 years and continuing until age 41 years.  Hepatitis C blood test.** / For all people born from [birth year] through 1965 and any individual with known risks for hepatitis C.  Skin self-exam. / Monthly.  Influenza vaccine. / Every year.  Tetanus, diphtheria, and acellular pertussis (Tdap/Td) vaccine.** / Consult your  health care provider. Pregnant women should receive 1 dose of Tdap vaccine during each pregnancy. 1 dose of Td every 10 years.  Varicella vaccine.** / Consult your health care provider. Pregnant females who do not have evidence of immunity should receive the first dose after pregnancy.  Zoster vaccine.** / 1 dose for adults aged 54 years or older.  Measles, mumps, rubella (MMR) vaccine.** / You need at least 1 dose of MMR if you were born in 1957 or later. You may also need a 2nd dose. For females of childbearing age, rubella immunity should be determined. If there is no evidence of immunity, females who are not pregnant should be vaccinated. If there is no evidence of immunity, females who are pregnant should delay immunization until after pregnancy.  Pneumococcal 13-valent conjugate (PCV13) vaccine.** / Consult your health care provider.  Pneumococcal polysaccharide (PPSV23) vaccine.** / 1 to 2 doses if you smoke cigarettes or if you have certain conditions.  Meningococcal vaccine.** / Consult your health care provider.  Hepatitis A vaccine.** / Consult your health care provider.  Hepatitis B vaccine.** / Consult your health care provider.  Haemophilus influenzae type b (Hib) vaccine.** / Consult your health care provider. Ages 31 years and over  Blood pressure check.** / Every 1 to 2 years.  Lipid and cholesterol check.** / Every 5 years beginning at age 35 years.  Lung cancer screening. / Every year if you are aged 21-80 years and have a 30-pack-year history of smoking and currently smoke or have quit within the past 15 years. Yearly screening is stopped once you have quit smoking for at least 15 years or develop a health problem that would prevent you from having lung cancer treatment.  Clinical breast exam.** / Every year after age 39 years.  BRCA-related cancer risk assessment.** / For women who have family members with a BRCA-related cancer (breast, ovarian, tubal, or peritoneal  cancers).  Mammogram.** / Every year beginning at age 74 years and continuing for as long as you are in good health. Consult with your health care provider.  Pap test.** / Every 3 years starting at age 87 years through age 79 or 57 years with 3 consecutive normal Pap tests. Testing can be stopped between 65 and 70 years with 3 consecutive normal Pap tests and no abnormal Pap or HPV tests in the past 10 years.  HPV screening.** / Every 3 years from ages 55 years through ages 45 or 62 years with a history of 3 consecutive normal Pap tests. Testing can be stopped between 65 and 70 years with 3 consecutive normal Pap tests and no abnormal Pap or HPV tests in the past 10 years.  Fecal occult blood test (FOBT) of stool. / Every year beginning at age 19 years and continuing until age 37 years. You may not need to do this test if you get a colonoscopy every 10 years.  Flexible sigmoidoscopy or colonoscopy.** / Every 5 years for a flexible sigmoidoscopy or every 10 years for a colonoscopy beginning at age 47 years and continuing until age 65 years.  Hepatitis C blood test.** / For all people born from [birth year] through 1965 and any individual with known risks for hepatitis C.  Osteoporosis screening.** / A one-time screening for women ages 48 years and over and women at risk for fractures or osteoporosis.  Skin self-exam. / Monthly.  Influenza vaccine. / Every year.  Tetanus, diphtheria, and acellular pertussis (Tdap/Td) vaccine.** / 1 dose of Td every 10 years.  Varicella vaccine.** / Consult your health care provider.  Zoster vaccine.** / 1 dose for adults aged 77 years or older.  Pneumococcal 13-valent conjugate (PCV13) vaccine.** / Consult your health care provider.  Pneumococcal polysaccharide (PPSV23) vaccine.** / 1 dose for all adults aged 43 years and older.  Meningococcal vaccine.** / Consult your health care provider.  Hepatitis A vaccine.** / Consult your health care  provider.  Hepatitis B vaccine.** / Consult your health care provider.  Haemophilus influenzae type b (Hib) vaccine.** / Consult your health care provider. ** Family history and personal history of risk and conditions may change your health care provider's recommendations. Document Released: 04/26/2001 Document Revised: 07/15/2013 Document Reviewed: 07/26/2010 [facility] Patient Information 2015 [place], [place]. This information is not intended to replace advice given to you by your health care provider. Make sure you discuss any questions you have with your health care provider.

## 2014-05-07 NOTE — Progress Notes (Signed)
  Subjective:     [name] is a 44 y.o. female and is here for a comprehensive physical exam. The patient reports problems - cycles are lasting longer than usual and are heavier than usual. Her primary MD does her annual labs. Last pap was AS-CUS with Neg HPV.  History   Social History  . Marital Status: Married    Spouse Name: N/A  . Number of Children: N/A  . Years of Education: N/A   Occupational History  . Not on file.   Social History Main Topics  . Smoking status: Never Smoker   . Smokeless tobacco: Never Used  . Alcohol Use: Yes     Comment: rare  . Drug Use: Not on file  . Sexual Activity:    Partners: Male    Pharmacist, hospitalBirth Control/ Protection: Surgical   Other Topics Concern  . Not on file   Social History Narrative   Health Maintenance  Topic Date Due  . HIV Screening  04/22/1985  . TETANUS/TDAP  04/22/1989  . INFLUENZA VACCINE  10/12/2013  . PAP SMEAR  04/17/2016    The following portions of the patient's history were reviewed and updated as appropriate: allergies, current medications, past family history, past medical history, past social history, past surgical history and problem list.  Review of Systems Pertinent items are noted in HPI.   Objective:    BP 130/83 mmHg  Pulse 80  Ht 5\' 4"  (1.626 m)  Wt 219 lb (99.338 kg)  BMI 37.57 kg/m2  LMP 05/06/2014 General appearance: alert, cooperative and appears stated age Head: Normocephalic, without obvious abnormality, atraumatic Neck: no adenopathy, supple, symmetrical, trachea midline and thyroid not enlarged, symmetric, no tenderness/mass/nodules Lungs: clear to auscultation bilaterally Breasts: normal appearance, no masses or tenderness Heart: regular rate and rhythm, S1, S2 normal, no murmur, click, rub or gallop Abdomen: soft, non-tender; bowel sounds normal; no masses,  no organomegaly Pelvic: cervix normal in appearance, external genitalia normal, no adnexal masses or tenderness, no cervical  motion tenderness, uterus normal size, shape, and consistency and vagina normal without discharge Extremities: extremities normal, atraumatic, no cyanosis or edema Pulses: 2+ and symmetric Skin: Skin color, texture, turgor normal. No rashes or lesions Lymph nodes: Cervical, supraclavicular, and axillary nodes normal. Neurologic: Grossly normal    Assessment:    Healthy female exam.      Plan:      Problem List Items Addressed This Visit    None    Visit Diagnoses    Screening for malignant neoplasm of cervix    -  Primary    Relevant Orders    Cytology - PAP    Encounter for routine gynecological examination        Relevant Orders    MM DIGITAL SCREENING BILATERAL       Return for EMB due to bleeding and age.  See After Visit Summary for Counseling Recommendations

## 2014-05-12 ENCOUNTER — Encounter: Payer: Self-pay | Admitting: Family Medicine

## 2014-05-12 LAB — CYTOLOGY - PAP

## 2014-05-20 ENCOUNTER — Ambulatory Visit (INDEPENDENT_AMBULATORY_CARE_PROVIDER_SITE_OTHER): Payer: Private Health Insurance - Indemnity | Admitting: Family Medicine

## 2014-05-20 ENCOUNTER — Encounter: Payer: Self-pay | Admitting: Family Medicine

## 2014-05-20 VITALS — BP 114/70 | HR 82 | Wt 219.0 lb

## 2014-05-20 DIAGNOSIS — N939 Abnormal uterine and vaginal bleeding, unspecified: Secondary | ICD-10-CM

## 2014-05-20 DIAGNOSIS — Z01812 Encounter for preprocedural laboratory examination: Secondary | ICD-10-CM

## 2014-05-20 LAB — POCT URINE PREGNANCY: PREG TEST UR: NEGATIVE

## 2014-05-20 NOTE — Assessment & Plan Note (Signed)
Check EMB--needs pelvic sono--treatment based on results

## 2014-05-20 NOTE — Progress Notes (Signed)
    Subjective:    Patient ID: Carol DauerJennifer H Dolson is a 44 y.o. female presenting with endometrial biopsy  on 05/20/2014  HPI: Here after yearly exam. Pt. Having heavier cycles that are lasting longer than usual. Also having a twinge behind her belly-button.  Review of Systems  Constitutional: Negative for fever and chills.  Respiratory: Negative for shortness of breath.   Cardiovascular: Negative for chest pain.  Gastrointestinal: Positive for abdominal pain. Negative for nausea and vomiting.  Genitourinary: Positive for vaginal bleeding and menstrual problem. Negative for dysuria.  Skin: Negative for rash.      Objective:    BP 114/70 mmHg  Pulse 82  Wt 219 lb (99.338 kg)  LMP 05/06/2014 Physical Exam  Constitutional: She is oriented to person, place, and time. She appears well-developed and well-nourished. No distress.  HENT:  Head: Normocephalic and atraumatic.  Eyes: No scleral icterus.  Neck: Neck supple.  Cardiovascular: Normal rate.   Pulmonary/Chest: Effort normal.  Abdominal: Soft.  Neurological: She is alert and oriented to person, place, and time.  Skin: Skin is warm and dry.  Psychiatric: She has a normal mood and affect.   Procedure: Patient given informed consent, signed copy in the chart, time out was performed. Appropriate time out taken. . The patient was placed in the lithotomy position and the cervix brought into view with sterile speculum.  Portio of cervix cleansed x 2 with betadine swabs.  A tenaculum was placed in the anterior lip of the cervix.  The uterus was sounded for depth of 10 cm. A pipelle was introduced to into the uterus, suction created,  and an endometrial sample was obtained. All equipment was removed and accounted for.  The patient tolerated the procedure well.    Patient given post procedure instructions.     Assessment & Plan:   Problem List Items Addressed This Visit      Unprioritized   Abnormal uterine bleeding    Check  EMB--needs pelvic sono--treatment based on results      Relevant Orders   Surgical pathology   US Pelvis Complete   US Transvaginal Non-OB    Other Visit Diagnoses    Pre-procedure lab exam    -  Primary    Relevant Orders    POCT urine pregnancy (Completed)        Return in about 4 weeks (around 06/17/2014).

## 2014-05-20 NOTE — Patient Instructions (Signed)
Endometrial Biopsy Endometrial biopsy is a procedure in which a tissue sample is taken from inside the uterus. The tissue sample is then looked at under a microscope to see if the tissue is normal or abnormal. The endometrium is the lining of the uterus. This procedure helps determine where you are in your menstrual cycle and how hormone levels are affecting the lining of the uterus. This procedure may also be used to evaluate uterine bleeding or to diagnose endometrial cancer, tuberculosis, polyps, or inflammatory conditions.  LET YOUR HEALTH CARE PROVIDER KNOW ABOUT:  Any allergies you have.  All medicines you are taking, including vitamins, herbs, eye drops, creams, and over-the-counter medicines.  Previous problems you or members of your family have had with the use of anesthetics.  Any blood disorders you have.  Previous surgeries you have had.  Medical conditions you have.  Possibility of pregnancy. RISKS AND COMPLICATIONS Generally, this is a safe procedure. However, as with any procedure, complications can occur. Possible complications include:  Bleeding.  Pelvic infection.  Puncture of the uterine wall with the biopsy device (rare). BEFORE THE PROCEDURE   Keep a record of your menstrual cycles as directed by your health care provider. You may need to schedule your procedure for a specific time in your cycle.  You may want to bring a sanitary pad to wear home after the procedure.  Arrange for someone to drive you home after the procedure if you will be given a medicine to help you relax (sedative). PROCEDURE   You may be given a sedative to relax you.  You will lie on an exam table with your feet and legs supported as in a pelvic exam.  Your health care provider will insert an instrument (speculum) into your vagina to see your cervix.  Your cervix will be cleansed with an antiseptic solution. A medicine (local anesthetic) will be used to numb the cervix.  A forceps  instrument (tenaculum) will be used to hold your cervix steady for the biopsy.  A thin, rodlike instrument (uterine sound) will be inserted through your cervix to determine the length of your uterus and the location where the biopsy sample will be removed.  A thin, flexible tube (catheter) will be inserted through your cervix and into the uterus. The catheter is used to collect the biopsy sample from your endometrial tissue.  The catheter and speculum will then be removed, and the tissue sample will be sent to a lab for examination. AFTER THE PROCEDURE  You will rest in a recovery area until you are ready to go home.  You may have mild cramping and a small amount of vaginal bleeding for a few days after the procedure. This is normal.  Make sure you find out how to get your test results. Document Released: 07/01/2004 Document Revised: 10/31/2012 Document Reviewed: 08/15/2012 [employer] Patient Information 2015 [employer]. This information is not intended to replace advice given to you by your health care provider. Make sure you discuss any questions you have with your health care provider.  

## 2014-06-03 ENCOUNTER — Ambulatory Visit: Payer: Private Health Insurance - Indemnity | Admitting: Family Medicine

## 2014-06-18 ENCOUNTER — Ambulatory Visit: Payer: Private Health Insurance - Indemnity | Admitting: Family Medicine

## 2014-06-24 ENCOUNTER — Ambulatory Visit: Payer: Private Health Insurance - Indemnity | Admitting: Family Medicine

## 2014-06-24 DIAGNOSIS — F322 Major depressive disorder, single episode, severe without psychotic features: Secondary | ICD-10-CM | POA: Insufficient documentation

## 2014-07-09 ENCOUNTER — Ambulatory Visit (INDEPENDENT_AMBULATORY_CARE_PROVIDER_SITE_OTHER): Payer: Managed Care, Other (non HMO) | Admitting: Family Medicine

## 2014-07-09 ENCOUNTER — Encounter: Payer: Self-pay | Admitting: Family Medicine

## 2014-07-09 VITALS — BP 121/80 | HR 84 | Wt 194.0 lb

## 2014-07-09 DIAGNOSIS — N939 Abnormal uterine and vaginal bleeding, unspecified: Secondary | ICD-10-CM | POA: Diagnosis not present

## 2014-07-09 MED ORDER — MEGESTROL ACETATE 40 MG PO TABS: 40.0000 mg | ORAL_TABLET | Freq: Two times a day (BID) | ORAL | Status: DC

## 2014-07-09 NOTE — Patient Instructions (Signed)
Endometrial Ablation Endometrial ablation removes the lining of the uterus (endometrium). It is usually a same-day, outpatient treatment. Ablation helps avoid major surgery, such as surgery to remove the cervix and uterus (hysterectomy). After endometrial ablation, you will have little or no menstrual bleeding and may not be able to have children. However, if you are premenopausal, you will need to use a reliable method of birth control following the procedure because of the small chance that pregnancy can occur. There are different reasons to have this procedure, which include:  Heavy periods.  Bleeding that is causing anemia.  Irregular bleeding.  Bleeding fibroids on the lining inside the uterus if they are smaller than 3 centimeters. This procedure should not be done if:  You want children in the future.  You have severe cramps with your menstrual period.  You have precancerous or cancerous cells in your uterus.  You were recently pregnant.  You have gone through menopause.  You have had major surgery on the uterus, such as a cesarean delivery. LET YOUR HEALTH CARE PROVIDER KNOW ABOUT:  Any allergies you have.  All medicines you are taking, including vitamins, herbs, eye drops, creams, and over-the-counter medicines.  Previous problems you or members of your family have had with the use of anesthetics.  Any blood disorders you have.  Previous surgeries you have had.  Medical conditions you have. RISKS AND COMPLICATIONS  Generally, this is a safe procedure. However, as with any procedure, complications can occur. Possible complications include:  Perforation of the uterus.  Bleeding.  Infection of the uterus, bladder, or vagina.  Injury to surrounding organs.  An air bubble to the lung (air embolus).  Pregnancy following the procedure.  Failure of the procedure to help the problem, requiring hysterectomy.  Decreased ability to diagnose cancer in the lining of  the uterus. BEFORE THE PROCEDURE  The lining of the uterus must be tested to make sure there is no pre-cancerous or cancer cells present.  An ultrasound may be performed to look at the size of the uterus and to check for abnormalities.  Medicines may be given to thin the lining of the uterus. PROCEDURE  During the procedure, your health care provider will use a tool called a resectoscope to help see inside your uterus. There are different ways to remove the lining of your uterus.   Radiofrequency - This method uses a radiofrequency-alternating electric current to remove the lining of the uterus.  Cryotherapy - This method uses extreme cold to freeze the lining of the uterus.  Heated-Free Liquid - This method uses heated salt (saline) solution to remove the lining of the uterus.  Microwave - This method uses high-energy microwaves to heat up the lining of the uterus to remove it.  Thermal balloon - This method involves inserting a catheter with a balloon tip into the uterus. The balloon tip is filled with heated fluid to remove the lining of the uterus. AFTER THE PROCEDURE  After your procedure, do not have sexual intercourse or insert anything into your vagina until permitted by your health care provider. After the procedure, you may experience:  Cramps.  Vaginal discharge.  Frequent urination. Document Released: 01/08/2004 Document Revised: 10/31/2012 Document Reviewed: 08/01/2012 [employer] Patient Information 2015 [employer]. This information is not intended to replace advice given to you by your health care provider. Make sure you discuss any questions you have with your health care provider.  

## 2014-07-09 NOTE — Progress Notes (Signed)
    Subjective:    Patient ID: Carol DauerJennifer H Townsend is a 44 y.o. female presenting with Results  on 07/09/2014  HPI: Here for f/u following a pelvic sono and EMB--biopsy was benign.  Pelvic sono shows possible endometrial polyp or growth with thickening and vascularity.  Review of Systems  Constitutional: Negative for fever and chills.  Respiratory: Negative for shortness of breath.   Cardiovascular: Negative for chest pain.  Gastrointestinal: Negative for nausea, vomiting and abdominal pain.  Genitourinary: Negative for dysuria.  Skin: Negative for rash.      Objective:    BP 121/80 mmHg  Pulse 84  Wt 194 lb (87.998 kg)  LMP 06/22/2014 Physical Exam  Constitutional: She is oriented to person, place, and time. She appears well-developed and well-nourished. No distress.  HENT:  Head: Normocephalic and atraumatic.  Eyes: No scleral icterus.  Neck: Neck supple.  Cardiovascular: Normal rate.   Pulmonary/Chest: Effort normal.  Abdominal: Soft.  Neurological: She is alert and oriented to person, place, and time.  Skin: Skin is warm and dry.  Psychiatric: She has a normal mood and affect.        Assessment & Plan:   Problem List Items Addressed This Visit      Unprioritized   Abnormal uterine bleeding - Primary    Decision made to proceed with D& C, hysteroscopy and HTA.  To more fully visualize the cavity and to treat her constant bleeding. Risks include but are not limited to bleeding, infection, injury to surrounding structures, including bowel, bladder and ureters, blood clots, and death.  Likelihood of success is high.       Relevant Medications   megestrol (MEGACE) 40 MG tablet       Return in about 6 weeks (around 08/20/2014) for postop check.  [name] 07/09/2014 11:13 AM

## 2014-07-09 NOTE — Assessment & Plan Note (Signed)
Decision made to proceed with D& C, hysteroscopy and HTA.  To more fully visualize the cavity and to treat her constant bleeding. Risks include but are not limited to bleeding, infection, injury to surrounding structures, including bowel, bladder and ureters, blood clots, and death.  Likelihood of success is high.

## 2014-07-14 ENCOUNTER — Encounter (HOSPITAL_COMMUNITY): Payer: Self-pay | Admitting: General Practice

## 2014-07-16 NOTE — H&P (Signed)
  [name] is an 44 y.o. [phone]  female.    Chief Complaint: Abnormal uterine bleeding  HPI: Pt. Having heavier cycles that are lasting longer than usual. Also having a twinge behind her belly-button.Work up included pelvic sono and EMB--biopsy was benign. Pelvic sono shows possible endometrial polyp or growth with thickening and vascularity.  Past Medical History  Diagnosis Date  . ADD (attention deficit disorder)   . Anxiety   . Depression   . Fracture     clavicle  . PONV (postoperative nausea and vomiting)     Past Surgical History  Procedure Laterality Date  . Cholecystectomy    . Tubal ligation    . Tubal ligation  2012    removal of left fallopian tube  . Wisdom tooth extraction Bilateral     Family History  Problem Relation Age of Onset  . Arthritis Mother   . Heart disease Mother   . Hypertension Mother   . Arthritis Father   . Diabetes Father   . Heart disease Father   . Hypertension Father   . Varicose Veins Father   . Arthritis Maternal Grandmother   . Depression Maternal Grandmother   . Stroke Maternal Grandfather   . Arthritis Paternal Grandmother   . Diabetes Paternal Grandmother   . Varicose Veins Paternal Grandmother   . Stroke Paternal Grandmother    Social History:  reports that she has never smoked. She has never used smokeless tobacco. She reports that she drinks alcohol. Her drug history is not on file.  Allergies: No Known Allergies  No current facility-administered medications on file prior to encounter.   Current Outpatient Prescriptions on File Prior to Encounter  Medication Sig Dispense Refill  . amphetamine-dextroamphetamine (ADDERALL) 10 MG tablet 1/2-1 tab BID prn.    . megestrol (MEGACE) 40 MG tablet Take 1 tablet (40 mg total) by mouth 2 (two) times daily. 30 tablet 3  . meloxicam (MOBIC) 15 MG tablet     . WELLBUTRIN XL 150 MG 24 hr tablet       Pertinent items are noted in HPI.  Height 5\' 4"  (1.626 m), weight 215  lb (97.523 kg), last menstrual period 06/29/2014. General appearance: alert, cooperative and appears stated age Head: Normocephalic, without obvious abnormality, atraumatic Neck: supple, symmetrical, trachea midline Lungs: normal effort Heart: regular rate and rhythm Abdomen: soft, non-tender; bowel sounds normal; no masses,  no organomegaly Extremities: extremities normal, atraumatic, no cyanosis or edema Skin: Skin color, texture, turgor normal. No rashes or lesions Neurologic: Grossly normal   Lab Results  Component Value Date   WBC 6.4 11/11/2009   HGB 14.2 11/11/2009   HCT 41.1 11/11/2009   MCV 94.1 11/11/2009   PLT 235 11/11/2009   Lab Results  Component Value Date   PREGTESTUR Negative 05/20/2014     Assessment/Plan Principal Problem:   Abnormal uterine bleeding   D& C, hysteroscopy and HTA. To more fully visualize the cavity and to treat her constant bleeding. Risks include but are not limited to bleeding, infection, injury to surrounding structures, including bowel, bladder and ureters, blood clots, and death. Likelihood of success is high.  PRATT,TANYA S 07/16/2014, 8:47 AM

## 2014-07-30 ENCOUNTER — Encounter (HOSPITAL_COMMUNITY): Payer: Self-pay | Admitting: Anesthesiology

## 2014-07-31 ENCOUNTER — Ambulatory Visit (HOSPITAL_COMMUNITY): Payer: Managed Care, Other (non HMO) | Admitting: Anesthesiology

## 2014-07-31 ENCOUNTER — Ambulatory Visit (HOSPITAL_COMMUNITY)
Admission: RE | Admit: 2014-07-31 | Discharge: 2014-07-31 | Disposition: A | Discharge status: Home / Self Care | Payer: Managed Care, Other (non HMO) | Source: Ambulatory Visit | Attending: Family Medicine | Admitting: Family Medicine

## 2014-07-31 ENCOUNTER — Encounter (HOSPITAL_COMMUNITY): Payer: Self-pay | Admitting: *Deleted

## 2014-07-31 ENCOUNTER — Encounter (HOSPITAL_COMMUNITY)
Admission: RE | Disposition: A | Discharge status: Home / Self Care | Payer: Self-pay | Source: Ambulatory Visit | Attending: Family Medicine

## 2014-07-31 DIAGNOSIS — F419 Anxiety disorder, unspecified: Secondary | ICD-10-CM | POA: Diagnosis not present

## 2014-07-31 DIAGNOSIS — N939 Abnormal uterine and vaginal bleeding, unspecified: Secondary | ICD-10-CM

## 2014-07-31 DIAGNOSIS — N920 Excessive and frequent menstruation with regular cycle: Secondary | ICD-10-CM | POA: Insufficient documentation

## 2014-07-31 DIAGNOSIS — F329 Major depressive disorder, single episode, unspecified: Secondary | ICD-10-CM | POA: Insufficient documentation

## 2014-07-31 DIAGNOSIS — Z79899 Other long term (current) drug therapy: Secondary | ICD-10-CM | POA: Insufficient documentation

## 2014-07-31 DIAGNOSIS — N840 Polyp of corpus uteri: Secondary | ICD-10-CM | POA: Diagnosis present

## 2014-07-31 DIAGNOSIS — F988 Other specified behavioral and emotional disorders with onset usually occurring in childhood and adolescence: Secondary | ICD-10-CM | POA: Diagnosis not present

## 2014-07-31 HISTORY — PX: DILITATION & CURRETTAGE/HYSTROSCOPY WITH HYDROTHERMAL ABLATION: SHX5570

## 2014-07-31 HISTORY — DX: Nausea with vomiting, unspecified: R11.2

## 2014-07-31 HISTORY — DX: Other specified postprocedural states: Z98.890

## 2014-07-31 LAB — CBC
HCT: 43.1 % (ref 36.0–46.0)
Hemoglobin: 14.5 g/dL (ref 12.0–15.0)
MCH: 31.1 pg (ref 26.0–34.0)
MCHC: 33.6 g/dL (ref 30.0–36.0)
MCV: 92.5 fL (ref 78.0–100.0)
PLATELETS: 273 10*3/uL (ref 150–400)
RBC: 4.66 MIL/uL (ref 3.87–5.11)
RDW: 14.1 % (ref 11.5–15.5)
WBC: 7.9 10*3/uL (ref 4.0–10.5)

## 2014-07-31 LAB — PREGNANCY, URINE: Preg Test, Ur: NEGATIVE

## 2014-07-31 SURGERY — DILATATION & CURETTAGE/HYSTEROSCOPY WITH HYDROTHERMAL ABLATION
Anesthesia: General | Site: Vagina

## 2014-07-31 MED ORDER — OXYCODONE-ACETAMINOPHEN 5-325 MG PO TABS: 1.0000 | ORAL_TABLET | Freq: Four times a day (QID) | ORAL | Status: DC | PRN

## 2014-07-31 MED ORDER — MIDAZOLAM HCL 2 MG/2ML IJ SOLN
INTRAMUSCULAR | Status: AC
  Filled 2014-07-31: qty 2

## 2014-07-31 MED ORDER — SCOPOLAMINE 1 MG/3DAYS TD PT72
MEDICATED_PATCH | TRANSDERMAL | Status: AC
  Filled 2014-07-31: qty 1

## 2014-07-31 MED ORDER — PROPOFOL 10 MG/ML IV BOLUS
INTRAVENOUS | Status: DC | PRN
  Administered 2014-07-31: 40 mg via INTRAVENOUS
  Administered 2014-07-31: 160 mg via INTRAVENOUS

## 2014-07-31 MED ORDER — LIDOCAINE HCL 1 % IJ SOLN
INTRAMUSCULAR | Status: AC
  Filled 2014-07-31: qty 20

## 2014-07-31 MED ORDER — KETOROLAC TROMETHAMINE 30 MG/ML IJ SOLN: 30.0000 mg | Freq: Once | INTRAMUSCULAR | Status: DC | PRN

## 2014-07-31 MED ORDER — SCOPOLAMINE 1 MG/3DAYS TD PT72
1.0000 | MEDICATED_PATCH | Freq: Once | TRANSDERMAL | Status: DC
  Administered 2014-07-31: 1.5 mg via TRANSDERMAL

## 2014-07-31 MED ORDER — DEXAMETHASONE SODIUM PHOSPHATE 10 MG/ML IJ SOLN
INTRAMUSCULAR | Status: DC | PRN
  Administered 2014-07-31: 4 mg via INTRAVENOUS

## 2014-07-31 MED ORDER — DEXAMETHASONE SODIUM PHOSPHATE 10 MG/ML IJ SOLN
INTRAMUSCULAR | Status: AC
  Filled 2014-07-31: qty 1

## 2014-07-31 MED ORDER — DEXAMETHASONE SODIUM PHOSPHATE 4 MG/ML IJ SOLN
INTRAMUSCULAR | Status: DC | PRN
  Administered 2014-07-31: 4 mg via INTRAVENOUS

## 2014-07-31 MED ORDER — FENTANYL CITRATE (PF) 100 MCG/2ML IJ SOLN
INTRAMUSCULAR | Status: DC | PRN
  Administered 2014-07-31: 50 ug via INTRAVENOUS
  Administered 2014-07-31: 25 ug via INTRAVENOUS
  Administered 2014-07-31: 50 ug via INTRAVENOUS
  Administered 2014-07-31: 25 ug via INTRAVENOUS
  Administered 2014-07-31: 50 ug via INTRAVENOUS

## 2014-07-31 MED ORDER — PROPOFOL 10 MG/ML IV BOLUS
INTRAVENOUS | Status: AC
  Filled 2014-07-31: qty 20

## 2014-07-31 MED ORDER — ONDANSETRON HCL 4 MG/2ML IJ SOLN: 4.0000 mg | Freq: Once | INTRAMUSCULAR | Status: DC | PRN

## 2014-07-31 MED ORDER — LIDOCAINE HCL (CARDIAC) 20 MG/ML IV SOLN
INTRAVENOUS | Status: DC | PRN
  Administered 2014-07-31: 60 mg via INTRAVENOUS

## 2014-07-31 MED ORDER — GLYCOPYRROLATE 0.2 MG/ML IJ SOLN
INTRAMUSCULAR | Status: AC
  Filled 2014-07-31: qty 1

## 2014-07-31 MED ORDER — FENTANYL CITRATE (PF) 100 MCG/2ML IJ SOLN
INTRAMUSCULAR | Status: AC
  Filled 2014-07-31: qty 2

## 2014-07-31 MED ORDER — MEPERIDINE HCL 25 MG/ML IJ SOLN
6.2500 mg | INTRAMUSCULAR | Status: DC | PRN
Start: 2014-07-31 — End: 2014-07-31

## 2014-07-31 MED ORDER — LACTATED RINGERS IV SOLN
INTRAVENOUS | Status: DC
  Administered 2014-07-31 (×2): via INTRAVENOUS

## 2014-07-31 MED ORDER — OXYCODONE-ACETAMINOPHEN 5-325 MG PO TABS
1.0000 | ORAL_TABLET | ORAL | Status: DC | PRN
  Administered 2014-07-31: 1 via ORAL

## 2014-07-31 MED ORDER — ONDANSETRON HCL 4 MG/2ML IJ SOLN
INTRAMUSCULAR | Status: AC
  Filled 2014-07-31: qty 2

## 2014-07-31 MED ORDER — LACTATED RINGERS IV SOLN: INTRAVENOUS | Status: DC

## 2014-07-31 MED ORDER — ONDANSETRON HCL 4 MG/2ML IJ SOLN
INTRAMUSCULAR | Status: DC | PRN
  Administered 2014-07-31: 4 mg via INTRAVENOUS

## 2014-07-31 MED ORDER — SODIUM CHLORIDE 0.9 % IR SOLN
Status: DC | PRN
  Administered 2014-07-31: 3000 mL

## 2014-07-31 MED ORDER — KETOROLAC TROMETHAMINE 30 MG/ML IJ SOLN
INTRAMUSCULAR | Status: DC | PRN
  Administered 2014-07-31: 30 mg via INTRAVENOUS

## 2014-07-31 MED ORDER — MIDAZOLAM HCL 2 MG/2ML IJ SOLN
INTRAMUSCULAR | Status: DC | PRN
  Administered 2014-07-31: 2 mg via INTRAVENOUS

## 2014-07-31 MED ORDER — GLYCOPYRROLATE 0.2 MG/ML IJ SOLN
INTRAMUSCULAR | Status: DC | PRN
  Administered 2014-07-31: 0.1 mg via INTRAVENOUS

## 2014-07-31 MED ORDER — DEXAMETHASONE SODIUM PHOSPHATE 10 MG/ML IJ SOLN: INTRAMUSCULAR | Status: DC | PRN

## 2014-07-31 MED ORDER — OXYCODONE-ACETAMINOPHEN 5-325 MG PO TABS
ORAL_TABLET | ORAL | Status: AC
  Filled 2014-07-31: qty 1

## 2014-07-31 MED ORDER — LIDOCAINE HCL (CARDIAC) 20 MG/ML IV SOLN
INTRAVENOUS | Status: AC
  Filled 2014-07-31: qty 5

## 2014-07-31 MED ORDER — LIDOCAINE HCL 1 % IJ SOLN
INTRAMUSCULAR | Status: DC | PRN
  Administered 2014-07-31: 20 mL

## 2014-07-31 MED ORDER — METOCLOPRAMIDE HCL 5 MG/ML IJ SOLN
INTRAMUSCULAR | Status: AC
  Filled 2014-07-31: qty 2

## 2014-07-31 MED ORDER — FENTANYL CITRATE (PF) 100 MCG/2ML IJ SOLN
25.0000 ug | INTRAMUSCULAR | Status: DC | PRN
  Administered 2014-07-31 (×3): 50 ug via INTRAVENOUS

## 2014-07-31 SURGICAL SUPPLY — 13 items
CANISTER SUCT 3000ML (MISCELLANEOUS) ×3 IMPLANT
CATH ROBINSON RED A/P 16FR (CATHETERS) ×3 IMPLANT
CLOTH BEACON ORANGE TIMEOUT ST (SAFETY) ×3 IMPLANT
CONTAINER PREFILL 10% NBF 60ML (FORM) ×6 IMPLANT
GLOVE BIOGEL PI IND STRL 7.0 (GLOVE) ×1 IMPLANT
GLOVE BIOGEL PI INDICATOR 7.0 (GLOVE) ×2
GLOVE ECLIPSE 7.0 STRL STRAW (GLOVE) ×6 IMPLANT
GOWN STRL REUS W/TWL LRG LVL3 (GOWN DISPOSABLE) ×9 IMPLANT
PACK VAGINAL MINOR WOMEN LF (CUSTOM PROCEDURE TRAY) ×3 IMPLANT
PAD OB MATERNITY 4.3X12.25 (PERSONAL CARE ITEMS) ×3 IMPLANT
SET GENESYS HTA PROCERVA (MISCELLANEOUS) ×3 IMPLANT
TOWEL OR 17X24 6PK STRL BLUE (TOWEL DISPOSABLE) ×6 IMPLANT
WATER STERILE IRR 1000ML POUR (IV SOLUTION) ×3 IMPLANT

## 2014-07-31 NOTE — Transfer of Care (Signed)
Immediate Anesthesia Transfer of Care Note  Patient: Carol DauerJennifer H Townsend  Procedure(s) Performed: Procedure(s): DILATATION & CURETTAGE/HYSTEROSCOPY WITH HYDROTHERMAL ABLATION (N/A)  Patient Location: PACU  Anesthesia Type:General  Level of Consciousness: sedated  Airway & Oxygen Therapy: Patient Spontanous Breathing and Patient connected to nasal cannula oxygen  Post-op Assessment: Report given to RN and Post -op Vital signs reviewed and stable  Post vital signs: stable  Last Vitals:  Filed Vitals:   07/31/14 1255  BP: 126/78  Pulse: 83  Temp: 37.1 C  Resp: 16    Complications: No apparent anesthesia complications

## 2014-07-31 NOTE — Discharge Instructions (Signed)
Hysteroscopy, Care After Refer to this sheet in the next few weeks. These instructions provide you with information on caring for yourself after your procedure. Your health care provider may also give you more specific instructions. Your treatment has been planned according to current medical practices, but problems sometimes occur. Call your health care provider if you have any problems or questions after your procedure.  WHAT TO EXPECT AFTER THE PROCEDURE After your procedure, it is typical to have the following:  You may have some cramping. This normally lasts for a couple days.  You may have bleeding. This can vary from light spotting for a few days to menstrual-like bleeding for 3-7 days. HOME CARE INSTRUCTIONS  Rest for the first 1-2 days after the procedure.  Only take over-the-counter or prescription medicines as directed by your health care provider. Do not take aspirin. It can increase the chances of bleeding.  Take showers instead of baths for 2 weeks or as directed by your health care provider.  Do not drive for 24 hours or as directed.  Do not drink alcohol while taking pain medicine.  Do not use tampons, douche, or have sexual intercourse for 2 weeks or until your health care provider says it is okay.  Take your temperature twice a day for 4-5 days. Write it down each time.  Follow your health care provider's advice about diet, exercise, and lifting.  If you develop constipation, you may:  Take a mild laxative if your health care provider approves.  Add bran foods to your diet.  Drink enough fluids to keep your urine clear or pale yellow.  Try to have someone with you or available to you for the first 24-48 hours, especially if you were given a general anesthetic.  Follow up with your health care provider as directed. SEEK MEDICAL CARE IF:  You feel dizzy or lightheaded.  You feel sick to your stomach (nauseous).  You have abnormal vaginal discharge.  You  have a rash.  You have pain that is not controlled with medicine. SEEK IMMEDIATE MEDICAL CARE IF:  You have bleeding that is heavier than a normal menstrual period.  You have a fever.  You have increasing cramps or pain, not controlled with medicine.  You have new belly (abdominal) pain.  You pass out.  You have pain in the tops of your shoulders (shoulder strap areas).  You have shortness of breath. Document Released: 12/19/2012 Document Reviewed: 12/19/2012 [facility] Patient Information 2015 [employer], [place]. This information is not intended to replace advice given to you by your health care provider. Make sure you discuss any questions you have with your health care provider.   MAY TAKE THE PATCH BEHIND YOUR EAR OFF IN 48 HOURS!! MAY TAKE IBUPROFEN (MOTRIN, ADVIL) OR ALEVE AFTER 8:30 PM FOR PAIN!!!

## 2014-07-31 NOTE — Anesthesia Preprocedure Evaluation (Signed)
Anesthesia Evaluation  Patient identified by MRN, date of birth, ID band Patient awake    Reviewed: Allergy & Precautions, H&P , NPO status , Patient's Chart, lab work & pertinent test results  Airway Mallampati: I  TM Distance: >3 FB Neck ROM: full    Dental no notable dental hx. (+) Teeth Intact   Pulmonary neg pulmonary ROS,    Pulmonary exam normal        Cardiovascular negative cardio ROS Normal cardiovascular exam     Neuro/Psych negative neurological ROS     GI/Hepatic negative GI ROS, Neg liver ROS,   Endo/Other  negative endocrine ROS  Renal/GU negative Renal ROS     Musculoskeletal   Abdominal Normal abdominal exam  (+)   Peds  Hematology negative hematology ROS (+)   Anesthesia Other Findings   Reproductive/Obstetrics negative OB ROS                             Anesthesia Physical Anesthesia Plan  ASA: II  Anesthesia Plan: General   Post-op Pain Management:    Induction: Intravenous  Airway Management Planned: LMA  Additional Equipment:   Intra-op Plan:   Post-operative Plan:   Informed Consent: I have reviewed the patients History and Physical, chart, labs and discussed the procedure including the risks, benefits and alternatives for the proposed anesthesia with the patient or authorized representative who has indicated his/her understanding and acceptance.     Plan Discussed with: CRNA and Surgeon  Anesthesia Plan Comments:         Anesthesia Quick Evaluation  

## 2014-07-31 NOTE — Op Note (Signed)
Preoperative diagnosis: Menorrhagia  Postoperative diagnosis: Menorrhagia  Procedure: HTA endometrial ablation, hysteroscopy, D & C  Surgeon: Shelbie Proctoranya S. Shawnie PonsPratt, M.D.  Anesthesia: GETT, paracervical block  Findings: Normal appearing cervix, uterine cavity revealed large polyp posteriorly and small fibroid vs. Polyp left lateral with both ostia seen.  Estimated blood loss: Minimal  Specimens: Endometrial curettings and polyp  Disposition of specimen: Pathology  Reason for procedure: Patient is a 44 y.o. [id number] with heavy vaginal bleeding with polyp and negative w/u who desired permanent treatment.   Procedure: Patient was taken to the OR where she was placed in dorsal lithotomy in Allen stirrups. SCDs were in place. An adequate timeout was obtained. The patient was prepped and draped in the usual sterile fashion. A red rubber catheter is used to drain her bladder. A speculum was placed inside the vagina and the cervix visualized. The cervix was grasped anteriorly with a single-tooth tenaculum. 20 cc of quarter percent Marcaine were injected for paracervical block. Sequential dilation was done to a #25 dilator, and the HTA with hysteroscope was introduced into the uterine cavity. The cervix and endometrial lining are described above both ostia were seen. A seal test was done and was passed. The uterine cavity was heated to between 80 and 90C and HTA was performed for 10 minutes. Cooling was then performed and the instrument removed. Polypectomy was performed with polyp forceps. Sharp curettage was then performed and sample sent to pathology. Repeat hysteroscopy was performed and revealed large poly had been removed. All instrument, needle, and lap counts were correct x2. The patient was awakened taken to recovery room in stable condition.  Sheresa Cullop S MD 07/31/2014 3:10 PM

## 2014-07-31 NOTE — Anesthesia Procedure Notes (Signed)
Procedure Name: LMA Insertion Date/Time: 07/31/2014 2:18 PM Performed by: Graciela HusbandsFUSSELL, Fatuma Dowers O Pre-anesthesia Checklist: Patient identified, Patient being monitored, Timeout performed, Emergency Drugs available and Suction available Patient Re-evaluated:Patient Re-evaluated prior to inductionOxygen Delivery Method: Circle system utilized Preoxygenation: Pre-oxygenation with 100% oxygen Intubation Type: IV induction LMA: LMA inserted LMA Size: 4.0 Number of attempts: 1 Placement Confirmation: breath sounds checked- equal and bilateral and positive ETCO2 Tube secured with: Tape Dental Injury: Teeth and Oropharynx as per pre-operative assessment

## 2014-07-31 NOTE — Anesthesia Postprocedure Evaluation (Signed)
Anesthesia Post Note  Patient: [name]  Procedure(s) Performed: Procedure(s) (LRB): DILATATION & CURETTAGE/HYSTEROSCOPY WITH HYDROTHERMAL ABLATION (N/A)  Anesthesia type: General  Patient location: PACU  Post pain: Pain level controlled  Post assessment: Post-op Vital signs reviewed  Last Vitals:  Filed Vitals:   07/31/14 1600  BP:   Pulse: 77  Temp:   Resp: 11    Post vital signs: Reviewed  Level of consciousness: sedated  Complications: No apparent anesthesia complications

## 2014-07-31 NOTE — Interval H&P Note (Signed)
History and Physical Interval Note:  07/31/2014 1:47 PM  [name]  has presented today for surgery, with the diagnosis of cpt [id number] - Endometrial polyp  The various methods of treatment have been discussed with the patient and family. After consideration of risks, benefits and other options for treatment, the patient has consented to  Procedure(s) with comments: DILATATION & CURETTAGE/HYSTEROSCOPY WITH HYDROTHERMAL ABLATION (N/A) - Requesting 07/31/14 @ 2:30p as a surgical intervention .  The patient's history has been reviewed, patient examined, no change in status, stable for surgery.  I have reviewed the patient's chart and labs.  Questions were answered to the patient's satisfaction.     Kambri Dismore S

## 2014-08-04 ENCOUNTER — Encounter (HOSPITAL_COMMUNITY): Payer: Self-pay | Admitting: Family Medicine

## 2014-08-04 ENCOUNTER — Telehealth: Payer: Self-pay | Admitting: *Deleted

## 2014-08-04 NOTE — Telephone Encounter (Signed)
Had surgery last week and has some questions.  Having trouble holding food down.  Feels like its sitting in her chest.  Feels like really bad heart burn.  She will try to take otc zantac and see if this helps her symptoms.  She will avoid highly acidic foods and stick to a bland diet for a few days as well.  If this does not resolve she will contact us back to follow up sooner.

## 2014-08-12 ENCOUNTER — Ambulatory Visit (INDEPENDENT_AMBULATORY_CARE_PROVIDER_SITE_OTHER): Payer: Managed Care, Other (non HMO) | Admitting: Family Medicine

## 2014-08-12 ENCOUNTER — Encounter: Payer: Self-pay | Admitting: Family Medicine

## 2014-08-12 VITALS — BP 125/83 | HR 85 | Ht 64.0 in | Wt 217.8 lb

## 2014-08-12 DIAGNOSIS — Z9889 Other specified postprocedural states: Secondary | ICD-10-CM

## 2014-08-12 DIAGNOSIS — N939 Abnormal uterine and vaginal bleeding, unspecified: Secondary | ICD-10-CM

## 2014-08-12 NOTE — Assessment & Plan Note (Addendum)
Improved s/p HTA and D and C with removal of polyps.--Pathology reviewed and benign.

## 2014-08-12 NOTE — Progress Notes (Signed)
    Subjective:    Patient ID: Carol DauerJennifer H Townsend is a 44 y.o. female presenting with Routine Post Op  on 08/12/2014  HPI: Here for f/u s/p HTA on 5/19. Doing well.  Minimal pain. Still sees an occasional streak of blood.   Review of Systems  Constitutional: Negative for chills and fatigue.  Gastrointestinal: Negative for abdominal pain.  Genitourinary: Negative for vaginal bleeding.      Objective:    BP 125/83 mmHg  Pulse 85  Ht 5\' 4"  (1.626 m)  Wt 217 lb 12.8 oz (98.793 kg)  BMI 37.37 kg/m2  LMP 06/29/2014 Physical Exam  Constitutional: She appears well-developed and well-nourished. No distress.  Pulmonary/Chest: Effort normal.  Abdominal: Soft. There is no tenderness.      Assessment & Plan:   Problem List Items Addressed This Visit      Unprioritized   Abnormal uterine bleeding - Primary    Improved s/p HTA and D and C with removal of polyps.--Pathology reviewed and benign.         Return if symptoms worsen or fail to improve.  Jakia Kennebrew S 08/12/2014 9:04 AM

## 2015-06-04 ENCOUNTER — Encounter: Payer: Self-pay | Admitting: Family Medicine

## 2015-06-04 ENCOUNTER — Ambulatory Visit (INDEPENDENT_AMBULATORY_CARE_PROVIDER_SITE_OTHER): Payer: Managed Care, Other (non HMO) | Admitting: Family Medicine

## 2015-06-04 VITALS — BP 114/78 | HR 75 | Ht 64.25 in | Wt 222.0 lb

## 2015-06-04 DIAGNOSIS — Z1151 Encounter for screening for human papillomavirus (HPV): Secondary | ICD-10-CM

## 2015-06-04 DIAGNOSIS — Z124 Encounter for screening for malignant neoplasm of cervix: Secondary | ICD-10-CM

## 2015-06-04 DIAGNOSIS — Z01419 Encounter for gynecological examination (general) (routine) without abnormal findings: Secondary | ICD-10-CM

## 2015-06-04 NOTE — Patient Instructions (Signed)
Preventive Care for Adults, Female A healthy lifestyle and preventive care can promote health and wellness. Preventive health guidelines for women include the following key practices.  A routine yearly physical is a good way to check with your health care provider about your health and preventive screening. It is a chance to share any concerns and updates on your health and to receive a thorough exam.  Visit your dentist for a routine exam and preventive care every 6 months. Brush your teeth twice a day and floss once a day. Good oral hygiene prevents tooth decay and gum disease.  The frequency of eye exams is based on your age, health, family medical history, use of contact lenses, and other factors. Follow your health care provider's recommendations for frequency of eye exams.  Eat a healthy diet. Foods like vegetables, fruits, whole grains, low-fat dairy products, and lean protein foods contain the nutrients you need without too many calories. Decrease your intake of foods high in solid fats, added sugars, and salt. Eat the right amount of calories for you.Get information about a proper diet from your health care provider, if necessary.  Regular physical exercise is one of the most important things you can do for your health. Most adults should get at least 150 minutes of moderate-intensity exercise (any activity that increases your heart rate and causes you to sweat) each week. In addition, most adults need muscle-strengthening exercises on 2 or more days a week.  Maintain a healthy weight. The body mass index (BMI) is a screening tool to identify possible weight problems. It provides an estimate of body fat based on height and weight. Your health care provider can find your BMI and can help you achieve or maintain a healthy weight.For adults 20 years and older:  A BMI below 18.5 is considered underweight.  A BMI of 18.5 to 24.9 is normal.  A BMI of 25 to 29.9 is considered overweight.  A  BMI of 30 and above is considered obese.  Maintain normal blood lipids and cholesterol levels by exercising and minimizing your intake of saturated fat. Eat a balanced diet with plenty of fruit and vegetables. Blood tests for lipids and cholesterol should begin at age 45 and be repeated every 5 years. If your lipid or cholesterol levels are high, you are over 50, or you are at high risk for heart disease, you may need your cholesterol levels checked more frequently.Ongoing high lipid and cholesterol levels should be treated with medicines if diet and exercise are not working.  If you smoke, find out from your health care provider how to quit. If you do not use tobacco, do not start.  Lung cancer screening is recommended for adults aged 45-80 years who are at high risk for developing lung cancer because of a history of smoking. A yearly low-dose CT scan of the lungs is recommended for people who have at least a 30-pack-year history of smoking and are a current smoker or have quit within the past 15 years. A pack year of smoking is smoking an average of 1 pack of cigarettes a day for 1 year (for example: 1 pack a day for 30 years or 2 packs a day for 15 years). Yearly screening should continue until the smoker has stopped smoking for at least 15 years. Yearly screening should be stopped for people who develop a health problem that would prevent them from having lung cancer treatment.  If you are pregnant, do not drink alcohol. If you are  breastfeeding, be very cautious about drinking alcohol. If you are not pregnant and choose to drink alcohol, do not have more than 1 drink per day. One drink is considered to be 12 ounces (355 mL) of beer, 5 ounces (148 mL) of wine, or 1.5 ounces (44 mL) of liquor.  Avoid use of street drugs. Do not share needles with anyone. Ask for help if you need support or instructions about stopping the use of drugs.  High blood pressure causes heart disease and increases the risk  of stroke. Your blood pressure should be checked at least every 1 to 2 years. Ongoing high blood pressure should be treated with medicines if weight loss and exercise do not work.  If you are 55-79 years old, ask your health care provider if you should take aspirin to prevent strokes.  Diabetes screening is done by taking a blood sample to check your blood glucose level after you have not eaten for a certain period of time (fasting). If you are not overweight and you do not have risk factors for diabetes, you should be screened once every 3 years starting at age 45. If you are overweight or obese and you are 40-70 years of age, you should be screened for diabetes every year as part of your cardiovascular risk assessment.  Breast cancer screening is essential preventive care for women. You should practice "breast self-awareness." This means understanding the normal appearance and feel of your breasts and may include breast self-examination. Any changes detected, no matter how small, should be reported to a health care provider. Women in their 20s and 30s should have a clinical breast exam (CBE) by a health care provider as part of a regular health exam every 1 to 3 years. After age 40, women should have a CBE every year. Starting at age 40, women should consider having a mammogram (breast X-ray test) every year. Women who have a family history of breast cancer should talk to their health care provider about genetic screening. Women at a high risk of breast cancer should talk to their health care providers about having an MRI and a mammogram every year.  Breast cancer gene (BRCA)-related cancer risk assessment is recommended for women who have family members with BRCA-related cancers. BRCA-related cancers include breast, ovarian, tubal, and peritoneal cancers. Having family members with these cancers may be associated with an increased risk for harmful changes (mutations) in the breast cancer genes BRCA1 and  BRCA2. Results of the assessment will determine the need for genetic counseling and BRCA1 and BRCA2 testing.  Your health care provider may recommend that you be screened regularly for cancer of the pelvic organs (ovaries, uterus, and vagina). This screening involves a pelvic examination, including checking for microscopic changes to the surface of your cervix (Pap test). You may be encouraged to have this screening done every 3 years, beginning at age 21.  For women ages 30-65, health care providers may recommend pelvic exams and Pap testing every 3 years, or they may recommend the Pap and pelvic exam, combined with testing for human papilloma virus (HPV), every 5 years. Some types of HPV increase your risk of cervical cancer. Testing for HPV may also be done on women of any age with unclear Pap test results.  Other health care providers may not recommend any screening for nonpregnant women who are considered low risk for pelvic cancer and who do not have symptoms. Ask your health care provider if a screening pelvic exam is right for   you.  If you have had past treatment for cervical cancer or a condition that could lead to cancer, you need Pap tests and screening for cancer for at least 20 years after your treatment. If Pap tests have been discontinued, your risk factors (such as having a new sexual partner) need to be reassessed to determine if screening should resume. Some women have medical problems that increase the chance of getting cervical cancer. In these cases, your health care provider may recommend more frequent screening and Pap tests.  Colorectal cancer can be detected and often prevented. Most routine colorectal cancer screening begins at the age of 50 years and continues through age 75 years. However, your health care provider may recommend screening at an earlier age if you have risk factors for colon cancer. On a yearly basis, your health care provider may provide home test kits to check  for hidden blood in the stool. Use of a small camera at the end of a tube, to directly examine the colon (sigmoidoscopy or colonoscopy), can detect the earliest forms of colorectal cancer. Talk to your health care provider about this at age 50, when routine screening begins. Direct exam of the colon should be repeated every 5-10 years through age 75 years, unless early forms of precancerous polyps or small growths are found.  People who are at an increased risk for hepatitis B should be screened for this virus. You are considered at high risk for hepatitis B if:  You were born in a country where hepatitis B occurs often. Talk with your health care provider about which countries are considered high risk.  Your parents were born in a high-risk country and you have not received a shot to protect against hepatitis B (hepatitis B vaccine).  You have HIV or AIDS.  You use needles to inject street drugs.  You live with, or have sex with, someone who has hepatitis B.  You get hemodialysis treatment.  You take certain medicines for conditions like cancer, organ transplantation, and autoimmune conditions.  Hepatitis C blood testing is recommended for all people born from 1945 through 1965 and any individual with known risks for hepatitis C.  Practice safe sex. Use condoms and avoid high-risk sexual practices to reduce the spread of sexually transmitted infections (STIs). STIs include gonorrhea, chlamydia, syphilis, trichomonas, herpes, HPV, and human immunodeficiency virus (HIV). Herpes, HIV, and HPV are viral illnesses that have no cure. They can result in disability, cancer, and death.  You should be screened for sexually transmitted illnesses (STIs) including gonorrhea and chlamydia if:  You are sexually active and are younger than 24 years.  You are older than 24 years and your health care provider tells you that you are at risk for this type of infection.  Your sexual activity has changed  since you were last screened and you are at an increased risk for chlamydia or gonorrhea. Ask your health care provider if you are at risk.  If you are at risk of being infected with HIV, it is recommended that you take a prescription medicine daily to prevent HIV infection. This is called preexposure prophylaxis (PrEP). You are considered at risk if:  You are sexually active and do not regularly use condoms or know the HIV status of your partner(s).  You take drugs by injection.  You are sexually active with a partner who has HIV.  Talk with your health care provider about whether you are at high risk of being infected with HIV. If   you choose to begin PrEP, you should first be tested for HIV. You should then be tested every 3 months for as long as you are taking PrEP.  Osteoporosis is a disease in which the bones lose minerals and strength with aging. This can result in serious bone fractures or breaks. The risk of osteoporosis can be identified using a bone density scan. Women ages 19 years and over and women at risk for fractures or osteoporosis should discuss screening with their health care providers. Ask your health care provider whether you should take a calcium supplement or vitamin D to reduce the rate of osteoporosis.  Menopause can be associated with physical symptoms and risks. Hormone replacement therapy is available to decrease symptoms and risks. You should talk to your health care provider about whether hormone replacement therapy is right for you.  Use sunscreen. Apply sunscreen liberally and repeatedly throughout the day. You should seek shade when your shadow is shorter than you. Protect yourself by wearing long sleeves, pants, a wide-brimmed hat, and sunglasses year round, whenever you are outdoors.  Once a month, do a whole body skin exam, using a mirror to look at the skin on your back. Tell your health care provider of new moles, moles that have irregular borders, moles that  are larger than a pencil eraser, or moles that have changed in shape or color.  Stay current with required vaccines (immunizations).  Influenza vaccine. All adults should be immunized every year.  Tetanus, diphtheria, and acellular pertussis (Td, Tdap) vaccine. Pregnant women should receive 1 dose of Tdap vaccine during each pregnancy. The dose should be obtained regardless of the length of time since the last dose. Immunization is preferred during the 27th-36th week of gestation. An adult who has not previously received Tdap or who does not know her vaccine status should receive 1 dose of Tdap. This initial dose should be followed by tetanus and diphtheria toxoids (Td) booster doses every 10 years. Adults with an unknown or incomplete history of completing a 3-dose immunization series with Td-containing vaccines should begin or complete a primary immunization series including a Tdap dose. Adults should receive a Td booster every 10 years.  Varicella vaccine. An adult without evidence of immunity to varicella should receive 2 doses or a second dose if she has previously received 1 dose. Pregnant females who do not have evidence of immunity should receive the first dose after pregnancy. This first dose should be obtained before leaving the health care facility. The second dose should be obtained 4-8 weeks after the first dose.  Human papillomavirus (HPV) vaccine. Females aged 13-26 years who have not received the vaccine previously should obtain the 3-dose series. The vaccine is not recommended for use in pregnant females. However, pregnancy testing is not needed before receiving a dose. If a female is found to be pregnant after receiving a dose, no treatment is needed. In that case, the remaining doses should be delayed until after the pregnancy. Immunization is recommended for any person with an immunocompromised condition through the age of 86 years if she did not get any or all doses earlier. During the  3-dose series, the second dose should be obtained 4-8 weeks after the first dose. The third dose should be obtained 24 weeks after the first dose and 16 weeks after the second dose.  Zoster vaccine. One dose is recommended for adults aged 63 years or older unless certain conditions are present.  Measles, mumps, and rubella (MMR) vaccine. Adults born  before 1957 generally are considered immune to measles and mumps. Adults born in 1957 or later should have 1 or more doses of MMR vaccine unless there is a contraindication to the vaccine or there is laboratory evidence of immunity to each of the three diseases. A routine second dose of MMR vaccine should be obtained at least 28 days after the first dose for students attending postsecondary schools, health care workers, or international travelers. People who received inactivated measles vaccine or an unknown type of measles vaccine during 1963-1967 should receive 2 doses of MMR vaccine. People who received inactivated mumps vaccine or an unknown type of mumps vaccine before 1979 and are at high risk for mumps infection should consider immunization with 2 doses of MMR vaccine. For females of childbearing age, rubella immunity should be determined. If there is no evidence of immunity, females who are not pregnant should be vaccinated. If there is no evidence of immunity, females who are pregnant should delay immunization until after pregnancy. Unvaccinated health care workers born before 1957 who lack laboratory evidence of measles, mumps, or rubella immunity or laboratory confirmation of disease should consider measles and mumps immunization with 2 doses of MMR vaccine or rubella immunization with 1 dose of MMR vaccine.  Pneumococcal 13-valent conjugate (PCV13) vaccine. When indicated, a person who is uncertain of his immunization history and has no record of immunization should receive the PCV13 vaccine. All adults 65 years of age and older should receive this  vaccine. An adult aged 19 years or older who has certain medical conditions and has not been previously immunized should receive 1 dose of PCV13 vaccine. This PCV13 should be followed with a dose of pneumococcal polysaccharide (PPSV23) vaccine. Adults who are at high risk for pneumococcal disease should obtain the PPSV23 vaccine at least 8 weeks after the dose of PCV13 vaccine. Adults older than 45 years of age who have normal immune system function should obtain the PPSV23 vaccine dose at least 1 year after the dose of PCV13 vaccine.  Pneumococcal polysaccharide (PPSV23) vaccine. When PCV13 is also indicated, PCV13 should be obtained first. All adults aged 65 years and older should be immunized. An adult younger than age 65 years who has certain medical conditions should be immunized. Any person who resides in a nursing home or long-term care facility should be immunized. An adult smoker should be immunized. People with an immunocompromised condition and certain other conditions should receive both PCV13 and PPSV23 vaccines. People with human immunodeficiency virus (HIV) infection should be immunized as soon as possible after diagnosis. Immunization during chemotherapy or radiation therapy should be avoided. Routine use of PPSV23 vaccine is not recommended for American Indians, [place] Natives, or people younger than 65 years unless there are medical conditions that require PPSV23 vaccine. When indicated, people who have unknown immunization and have no record of immunization should receive PPSV23 vaccine. One-time revaccination 5 years after the first dose of PPSV23 is recommended for people aged 19-64 years who have chronic kidney failure, nephrotic syndrome, asplenia, or immunocompromised conditions. People who received 1-2 doses of PPSV23 before age 65 years should receive another dose of PPSV23 vaccine at age 65 years or later if at least 5 years have passed since the previous dose. Doses of PPSV23 are not  needed for people immunized with PPSV23 at or after age 65 years.  Meningococcal vaccine. Adults with asplenia or persistent complement component deficiencies should receive 2 doses of quadrivalent meningococcal conjugate (MenACWY-D) vaccine. The doses should be obtained   at least 2 months apart. Microbiologists working with certain meningococcal bacteria, [place] recruits, people at risk during an outbreak, and people who travel to or live in countries with a high rate of meningitis should be immunized. A first-year college student up through age 34 years who is living in a residence hall should receive a dose if she did not receive a dose on or after her 16th birthday. Adults who have certain high-risk conditions should receive one or more doses of vaccine.  Hepatitis A vaccine. Adults who wish to be protected from this disease, have certain high-risk conditions, work with hepatitis A-infected animals, work in hepatitis A research labs, or travel to or work in countries with a high rate of hepatitis A should be immunized. Adults who were previously unvaccinated and who anticipate close contact with an international adoptee during the first 60 days after arrival in the [place] from a country with a high rate of hepatitis A should be immunized.  Hepatitis B vaccine. Adults who wish to be protected from this disease, have certain high-risk conditions, may be exposed to blood or other infectious body fluids, are household contacts or sex partners of hepatitis B positive people, are clients or workers in certain care facilities, or travel to or work in countries with a high rate of hepatitis B should be immunized.  Haemophilus influenzae type b (Hib) vaccine. A previously unvaccinated person with asplenia or sickle cell disease or having a scheduled splenectomy should receive 1 dose of Hib vaccine. Regardless of previous immunization, a recipient of a hematopoietic stem cell transplant should receive a  3-dose series 6-12 months after her successful transplant. Hib vaccine is not recommended for adults with HIV infection. Preventive Services / Frequency Ages 35 to 4 years  Blood pressure check.** / Every 3-5 years.  Lipid and cholesterol check.** / Every 5 years beginning at age 60.  Clinical breast exam.** / Every 3 years for women in their 71s and 10s.  BRCA-related cancer risk assessment.** / For women who have family members with a BRCA-related cancer (breast, ovarian, tubal, or peritoneal cancers).  Pap test.** / Every 2 years from ages 76 through 26. Every 3 years starting at age 61 through age 76 or 93 with a history of 3 consecutive normal Pap tests.  HPV screening.** / Every 3 years from ages 37 through ages 60 to 51 with a history of 3 consecutive normal Pap tests.  Hepatitis C blood test.** / For any individual with known risks for hepatitis C.  Skin self-exam. / Monthly.  Influenza vaccine. / Every year.  Tetanus, diphtheria, and acellular pertussis (Tdap, Td) vaccine.** / Consult your health care provider. Pregnant women should receive 1 dose of Tdap vaccine during each pregnancy. 1 dose of Td every 10 years.  Varicella vaccine.** / Consult your health care provider. Pregnant females who do not have evidence of immunity should receive the first dose after pregnancy.  HPV vaccine. / 3 doses over 6 months, if 93 and younger. The vaccine is not recommended for use in pregnant females. However, pregnancy testing is not needed before receiving a dose.  Measles, mumps, rubella (MMR) vaccine.** / You need at least 1 dose of MMR if you were born in 1957 or later. You may also need a 2nd dose. For females of childbearing age, rubella immunity should be determined. If there is no evidence of immunity, females who are not pregnant should be vaccinated. If there is no evidence of immunity, females who are  pregnant should delay immunization until after pregnancy.  Pneumococcal  13-valent conjugate (PCV13) vaccine.** / Consult your health care provider.  Pneumococcal polysaccharide (PPSV23) vaccine.** / 1 to 2 doses if you smoke cigarettes or if you have certain conditions.  Meningococcal vaccine.** / 1 dose if you are age 69 to 24 years and a Market researcher living in a residence hall, or have one of several medical conditions, you need to get vaccinated against meningococcal disease. You may also need additional booster doses.  Hepatitis A vaccine.** / Consult your health care provider.  Hepatitis B vaccine.** / Consult your health care provider.  Haemophilus influenzae type b (Hib) vaccine.** / Consult your health care provider. Ages 75 to 41 years  Blood pressure check.** / Every year.  Lipid and cholesterol check.** / Every 5 years beginning at age 31 years.  Lung cancer screening. / Every year if you are aged 105-80 years and have a 30-pack-year history of smoking and currently smoke or have quit within the past 15 years. Yearly screening is stopped once you have quit smoking for at least 15 years or develop a health problem that would prevent you from having lung cancer treatment.  Clinical breast exam.** / Every year after age 30 years.  BRCA-related cancer risk assessment.** / For women who have family members with a BRCA-related cancer (breast, ovarian, tubal, or peritoneal cancers).  Mammogram.** / Every year beginning at age 5 years and continuing for as long as you are in good health. Consult with your health care provider.  Pap test.** / Every 3 years starting at age 43 years through age 2 or 60 years with a history of 3 consecutive normal Pap tests.  HPV screening.** / Every 3 years from ages 29 years through ages 40 to 43 years with a history of 3 consecutive normal Pap tests.  Fecal occult blood test (FOBT) of stool. / Every year beginning at age 26 years and continuing until age 11 years. You may not need to do this test if you get  a colonoscopy every 10 years.  Flexible sigmoidoscopy or colonoscopy.** / Every 5 years for a flexible sigmoidoscopy or every 10 years for a colonoscopy beginning at age 54 years and continuing until age 64 years.  Hepatitis C blood test.** / For all people born from [birth year] through 1965 and any individual with known risks for hepatitis C.  Skin self-exam. / Monthly.  Influenza vaccine. / Every year.  Tetanus, diphtheria, and acellular pertussis (Tdap/Td) vaccine.** / Consult your health care provider. Pregnant women should receive 1 dose of Tdap vaccine during each pregnancy. 1 dose of Td every 10 years.  Varicella vaccine.** / Consult your health care provider. Pregnant females who do not have evidence of immunity should receive the first dose after pregnancy.  Zoster vaccine.** / 1 dose for adults aged 70 years or older.  Measles, mumps, rubella (MMR) vaccine.** / You need at least 1 dose of MMR if you were born in 1957 or later. You may also need a second dose. For females of childbearing age, rubella immunity should be determined. If there is no evidence of immunity, females who are not pregnant should be vaccinated. If there is no evidence of immunity, females who are pregnant should delay immunization until after pregnancy.  Pneumococcal 13-valent conjugate (PCV13) vaccine.** / Consult your health care provider.  Pneumococcal polysaccharide (PPSV23) vaccine.** / 1 to 2 doses if you smoke cigarettes or if you have certain conditions.  Meningococcal vaccine.** /  Consult your health care provider.  Hepatitis A vaccine.** / Consult your health care provider.  Hepatitis B vaccine.** / Consult your health care provider.  Haemophilus influenzae type b (Hib) vaccine.** / Consult your health care provider. Ages 69 years and over  Blood pressure check.** / Every year.  Lipid and cholesterol check.** / Every 5 years beginning at age 24 years.  Lung cancer screening. / Every year if you  are aged 63-80 years and have a 30-pack-year history of smoking and currently smoke or have quit within the past 15 years. Yearly screening is stopped once you have quit smoking for at least 15 years or develop a health problem that would prevent you from having lung cancer treatment.  Clinical breast exam.** / Every year after age 63 years.  BRCA-related cancer risk assessment.** / For women who have family members with a BRCA-related cancer (breast, ovarian, tubal, or peritoneal cancers).  Mammogram.** / Every year beginning at age 52 years and continuing for as long as you are in good health. Consult with your health care provider.  Pap test.** / Every 3 years starting at age 53 years through age 31 or 13 years with 3 consecutive normal Pap tests. Testing can be stopped between 65 and 70 years with 3 consecutive normal Pap tests and no abnormal Pap or HPV tests in the past 10 years.  HPV screening.** / Every 3 years from ages 53 years through ages 29 or 35 years with a history of 3 consecutive normal Pap tests. Testing can be stopped between 65 and 70 years with 3 consecutive normal Pap tests and no abnormal Pap or HPV tests in the past 10 years.  Fecal occult blood test (FOBT) of stool. / Every year beginning at age 65 years and continuing until age 31 years. You may not need to do this test if you get a colonoscopy every 10 years.  Flexible sigmoidoscopy or colonoscopy.** / Every 5 years for a flexible sigmoidoscopy or every 10 years for a colonoscopy beginning at age 51 years and continuing until age 29 years.  Hepatitis C blood test.** / For all people born from [birth year] through 1965 and any individual with known risks for hepatitis C.  Osteoporosis screening.** / A one-time screening for women ages 22 years and over and women at risk for fractures or osteoporosis.  Skin self-exam. / Monthly.  Influenza vaccine. / Every year.  Tetanus, diphtheria, and acellular pertussis (Tdap/Td)  vaccine.** / 1 dose of Td every 10 years.  Varicella vaccine.** / Consult your health care provider.  Zoster vaccine.** / 1 dose for adults aged 80 years or older.  Pneumococcal 13-valent conjugate (PCV13) vaccine.** / Consult your health care provider.  Pneumococcal polysaccharide (PPSV23) vaccine.** / 1 dose for all adults aged 38 years and older.  Meningococcal vaccine.** / Consult your health care provider.  Hepatitis A vaccine.** / Consult your health care provider.  Hepatitis B vaccine.** / Consult your health care provider.  Haemophilus influenzae type b (Hib) vaccine.** / Consult your health care provider. ** Family history and personal history of risk and conditions may change your health care provider's recommendations.   This information is not intended to replace advice given to you by your health care provider. Make sure you discuss any questions you have with your health care provider.   Document Released: 04/26/2001 Document Revised: 03/21/2014 Document Reviewed: 07/26/2010 Elsevier Interactive Patient Education [employer].

## 2015-06-04 NOTE — Progress Notes (Signed)
  Subjective:     Carol DauerJennifer H Redmond is a 45 y.o. female and is here for a comprehensive physical exam. The patient reports problems - anxiety - sees PCP for this. Had HTA last year and has regular cycles with just some spotting.  Social History   Social History  . Marital Status: Married    Spouse Name: N/A  . Number of Children: N/A  . Years of Education: N/A   Occupational History  . Not on file.   Social History Main Topics  . Smoking status: Never Smoker   . Smokeless tobacco: Never Used  . Alcohol Use: Yes     Comment: rare  . Drug Use: Not on file  . Sexual Activity:    Partners: Male    Pharmacist, hospitalBirth Control/ Protection: Surgical   Other Topics Concern  . Not on file   Social History Narrative   Health Maintenance  Topic Date Due  . HIV Screening  04/22/1985  . TETANUS/TDAP  04/22/1989  . INFLUENZA VACCINE  10/13/2014  . PAP SMEAR  05/07/2017    The following portions of the patient's history were reviewed and updated as appropriate: allergies, current medications, past family history, past medical history, past social history, past surgical history and problem list.  Review of Systems Pertinent items noted in HPI and remainder of comprehensive ROS otherwise negative.   Objective:    BP 114/78 mmHg  Pulse 75  Ht 5' 4.25" (1.632 m)  Wt 222 lb (100.699 kg)  BMI 37.81 kg/m2  LMP 05/14/2015 General appearance: alert, cooperative, appears stated age and moderately obese Head: Normocephalic, without obvious abnormality, atraumatic Lungs: clear to auscultation bilaterally Breasts: normal appearance, no masses or tenderness Heart: regular rate and rhythm, S1, S2 normal, no murmur, click, rub or gallop Abdomen: soft, non-tender; bowel sounds normal; no masses,  no organomegaly Pelvic: cervix normal in appearance, external genitalia normal, no adnexal masses or tenderness, no cervical motion tenderness, uterus normal size, shape, and consistency and vagina normal without  discharge Extremities: extremities normal, atraumatic, no cyanosis or edema Pulses: 2+ and symmetric Skin: Skin color, texture, turgor normal. No rashes or lesions Lymph nodes: Cervical, supraclavicular, and axillary nodes normal. Neurologic: Grossly normal    Assessment/Plan:    Healthy female exam.      1. Screening for malignant neoplasm of cervix  - Cytology - PAP  2. Encounter for routine gynecological examination  - MM DIGITAL SCREENING BILATERAL; Future Labs per PCP Declines flu   See After Visit Summary for Counseling Recommendations

## 2015-06-08 ENCOUNTER — Encounter: Payer: Self-pay | Admitting: *Deleted

## 2015-06-08 LAB — CYTOLOGY - PAP

## 2016-04-07 ENCOUNTER — Telehealth: Payer: Self-pay | Admitting: Radiology

## 2016-04-07 NOTE — Telephone Encounter (Signed)
Left message on patient cell phone 04/07/16. Called to schedule Annual Exam with Dr Shawnie PonsPratt (approx. 06/03/16)

## 2016-04-26 ENCOUNTER — Ambulatory Visit: Payer: Managed Care, Other (non HMO) | Admitting: Family Medicine

## 2016-06-09 ENCOUNTER — Encounter: Payer: Self-pay | Admitting: Family Medicine

## 2016-06-09 ENCOUNTER — Ambulatory Visit (INDEPENDENT_AMBULATORY_CARE_PROVIDER_SITE_OTHER): Payer: 59 | Admitting: Family Medicine

## 2016-06-09 VITALS — BP 122/82 | HR 88 | Resp 18 | Ht 64.0 in | Wt 225.0 lb

## 2016-06-09 DIAGNOSIS — Z01419 Encounter for gynecological examination (general) (routine) without abnormal findings: Secondary | ICD-10-CM | POA: Diagnosis not present

## 2016-06-09 NOTE — Progress Notes (Signed)
   Subjective:     [name] is a 46 y.o. female and is here for a comprehensive physical exam. The patient reports problems - abdominal pain.Notes some lightening shooting pain in her abdomen that starts at her umbilicus. Still with normal cycle that is light following her HTA. Notes some cycles are q 21 days.  Also notes some stress urinary incontinence.  Social History   Social History  . Marital status: Married    Spouse name: N/A  . Number of children: N/A  . Years of education: N/A   Occupational History  . Not on file.   Social History Main Topics  . Smoking status: Never Smoker  . Smokeless tobacco: Never Used  . Alcohol use Yes     Comment: rare  . Drug use: No  . Sexual activity: Yes    Partners: Male    Birth control/ protection: Surgical   Other Topics Concern  . Not on file   Social History Narrative  . No narrative on file   Health Maintenance  Topic Date Due  . HIV Screening  04/22/1985  . TETANUS/TDAP  04/22/1989  . INFLUENZA VACCINE  10/13/2015  . PAP SMEAR  06/04/2018    The following portions of the patient's history were reviewed and updated as appropriate: allergies, current medications, past family history, past medical history, past social history, past surgical history and problem list.  Review of Systems Pertinent items noted in HPI and remainder of comprehensive ROS otherwise negative.   Objective:    BP 122/82 (BP Location: Left Arm, Patient Position: Sitting, Cuff Size: Large)   Pulse 88   Resp 18   Ht 5\' 4"  (1.626 m)   Wt 225 lb (102.1 kg)   LMP 05/20/2016   BMI 38.62 kg/m  General appearance: alert, cooperative and appears stated age Head: Normocephalic, without obvious abnormality, atraumatic Neck: no adenopathy, supple, symmetrical, trachea midline and thyroid not enlarged, symmetric, no tenderness/mass/nodules Lungs: clear to auscultation bilaterally Breasts: normal appearance, no masses or tenderness Heart: regular  rate and rhythm, S1, S2 normal, no murmur, click, rub or gallop Abdomen: soft, non-tender; bowel sounds normal; no masses,  no organomegaly Pelvic: cervix normal in appearance, external genitalia normal, no adnexal masses or tenderness, no cervical motion tenderness, uterus normal size, shape, and consistency and vagina normal without discharge Extremities: extremities normal, atraumatic, no cyanosis or edema Pulses: 2+ and symmetric Skin: Skin color, texture, turgor normal. No rashes or lesions Lymph nodes: Cervical, supraclavicular, and axillary nodes normal. Neurologic: Grossly normal    Assessment:    Healthy female exam.      Plan:      Problem List Items Addressed This Visit    None    Visit Diagnoses    Encounter for gynecological examination without abnormal finding    -  Primary   Relevant Orders   CBC   Comprehensive metabolic panel   TSH   Lipid panel   MM DIGITAL SCREENING BILATERAL     Normal pap last year.  See After Visit Summary for Counseling Recommendations

## 2016-06-09 NOTE — Patient Instructions (Signed)

## 2016-06-10 LAB — COMPREHENSIVE METABOLIC PANEL
ALBUMIN: 4.1 g/dL (ref 3.5–5.5)
ALT: 22 IU/L (ref 0–32)
AST: 22 IU/L (ref 0–40)
Albumin/Globulin Ratio: 1.5 (ref 1.2–2.2)
Alkaline Phosphatase: 100 IU/L (ref 39–117)
BUN / CREAT RATIO: 31 — AB (ref 9–23)
BUN: 21 mg/dL (ref 6–24)
Bilirubin Total: 0.3 mg/dL (ref 0.0–1.2)
CHLORIDE: 102 mmol/L (ref 96–106)
CO2: 26 mmol/L (ref 18–29)
Calcium: 9.8 mg/dL (ref 8.7–10.2)
Creatinine, Ser: 0.67 mg/dL (ref 0.57–1.00)
GFR calc Af Amer: 122 mL/min/{1.73_m2} (ref >59)
GFR calc non Af Amer: 106 mL/min/{1.73_m2} (ref >59)
GLOBULIN, TOTAL: 2.8 g/dL (ref 1.5–4.5)
Glucose: 73 mg/dL (ref 65–99)
Potassium: 4.3 mmol/L (ref 3.5–5.2)
SODIUM: 143 mmol/L (ref 134–144)
Total Protein: 6.9 g/dL (ref 6.0–8.5)

## 2016-06-10 LAB — CBC
HEMOGLOBIN: 15 g/dL (ref 11.1–15.9)
Hematocrit: 45.8 % (ref 34.0–46.6)
MCH: 30.6 pg (ref 26.6–33.0)
MCHC: 32.8 g/dL (ref 31.5–35.7)
MCV: 94 fL (ref 79–97)
Platelets: 295 10*3/uL (ref 150–379)
RBC: 4.9 x10E6/uL (ref 3.77–5.28)
RDW: 14.2 % (ref 12.3–15.4)
WBC: 6.7 10*3/uL (ref 3.4–10.8)

## 2016-06-10 LAB — LIPID PANEL
Chol/HDL Ratio: 4.3 ratio units (ref 0.0–4.4)
Cholesterol, Total: 193 mg/dL (ref 100–199)
HDL: 45 mg/dL (ref >39)
LDL Calculated: 108 mg/dL — ABNORMAL HIGH (ref 0–99)
Triglycerides: 202 mg/dL — ABNORMAL HIGH (ref 0–149)
VLDL CHOLESTEROL CAL: 40 mg/dL (ref 5–40)

## 2016-06-10 LAB — TSH: TSH: 1.47 u[IU]/mL (ref 0.450–4.500)

## 2016-06-14 ENCOUNTER — Other Ambulatory Visit: Payer: Self-pay | Admitting: Family Medicine

## 2017-01-10 ENCOUNTER — Inpatient Hospital Stay: Admission: RE | Admit: 2017-01-10 | Payer: 59 | Source: Ambulatory Visit

## 2017-01-25 ENCOUNTER — Inpatient Hospital Stay: Admission: RE | Admit: 2017-01-25 | Payer: 59 | Source: Ambulatory Visit

## 2017-02-15 ENCOUNTER — Ambulatory Visit
Admission: RE | Admit: 2017-02-15 | Discharge: 2017-02-15 | Disposition: A | Discharge status: Home / Self Care | Payer: 59 | Source: Ambulatory Visit | Attending: Family Medicine | Admitting: Family Medicine

## 2017-02-15 DIAGNOSIS — Z1231 Encounter for screening mammogram for malignant neoplasm of breast: Secondary | ICD-10-CM | POA: Insufficient documentation

## 2017-02-15 DIAGNOSIS — Z01419 Encounter for gynecological examination (general) (routine) without abnormal findings: Secondary | ICD-10-CM

## 2017-02-17 ENCOUNTER — Inpatient Hospital Stay
Admission: RE | Admit: 2017-02-17 | Discharge: 2017-02-17 | Disposition: A | Discharge status: Home / Self Care | Payer: Self-pay | Source: Ambulatory Visit | Attending: *Deleted | Admitting: *Deleted

## 2017-02-17 ENCOUNTER — Other Ambulatory Visit: Payer: Self-pay | Admitting: *Deleted

## 2017-02-17 DIAGNOSIS — Z9289 Personal history of other medical treatment: Secondary | ICD-10-CM

## 2017-03-30 ENCOUNTER — Encounter: Payer: Self-pay | Admitting: Radiology

## 2017-04-12 ENCOUNTER — Telehealth: Payer: Self-pay | Admitting: Radiology

## 2017-04-12 NOTE — Telephone Encounter (Signed)
Left message to call [email or web address] to schedule appointment,

## 2017-06-01 ENCOUNTER — Other Ambulatory Visit: Payer: Self-pay | Admitting: Physician Assistant

## 2017-06-01 ENCOUNTER — Ambulatory Visit
Admission: RE | Admit: 2017-06-01 | Discharge: 2017-06-01 | Disposition: A | Discharge status: Home / Self Care | Payer: Managed Care, Other (non HMO) | Source: Ambulatory Visit | Attending: Physician Assistant | Admitting: Physician Assistant

## 2017-06-01 DIAGNOSIS — M545 Low back pain, unspecified: Secondary | ICD-10-CM

## 2017-06-01 DIAGNOSIS — G8929 Other chronic pain: Secondary | ICD-10-CM | POA: Diagnosis present

## 2017-06-07 ENCOUNTER — Ambulatory Visit: Payer: Managed Care, Other (non HMO) | Admitting: Family Medicine

## 2017-06-07 NOTE — Progress Notes (Deleted)
Normal pap 05/2015

## 2017-07-05 ENCOUNTER — Encounter: Payer: Self-pay | Admitting: Family Medicine

## 2017-07-05 ENCOUNTER — Ambulatory Visit (INDEPENDENT_AMBULATORY_CARE_PROVIDER_SITE_OTHER): Payer: Managed Care, Other (non HMO) | Admitting: Family Medicine

## 2017-07-05 VITALS — BP 122/82 | HR 90 | Wt 235.0 lb

## 2017-07-05 DIAGNOSIS — M25552 Pain in left hip: Secondary | ICD-10-CM | POA: Diagnosis not present

## 2017-07-05 DIAGNOSIS — Z1151 Encounter for screening for human papillomavirus (HPV): Secondary | ICD-10-CM | POA: Diagnosis not present

## 2017-07-05 DIAGNOSIS — Z01419 Encounter for gynecological examination (general) (routine) without abnormal findings: Secondary | ICD-10-CM | POA: Diagnosis not present

## 2017-07-05 DIAGNOSIS — M25551 Pain in right hip: Secondary | ICD-10-CM

## 2017-07-05 DIAGNOSIS — Z124 Encounter for screening for malignant neoplasm of cervix: Secondary | ICD-10-CM | POA: Diagnosis not present

## 2017-07-05 NOTE — Patient Instructions (Signed)
Preventive Care 40-64 Years, Female Preventive care refers to lifestyle choices and visits with your health care provider that can promote health and wellness. What does preventive care include?  A yearly physical exam. This is also called an annual well check.  Dental exams once or twice a year.  Routine eye exams. Ask your health care provider how often you should have your eyes checked.  Personal lifestyle choices, including: ? Daily care of your teeth and gums. ? Regular physical activity. ? Eating a healthy diet. ? Avoiding tobacco and drug use. ? Limiting alcohol use. ? Practicing safe sex. ? Taking low-dose aspirin daily starting at age 58. ? Taking vitamin and mineral supplements as recommended by your health care provider. What happens during an annual well check? The services and screenings done by your health care provider during your annual well check will depend on your age, overall health, lifestyle risk factors, and family history of disease. Counseling Your health care provider may ask you questions about your:  Alcohol use.  Tobacco use.  Drug use.  Emotional well-being.  Home and relationship well-being.  Sexual activity.  Eating habits.  Work and work Statistician.  Method of birth control.  Menstrual cycle.  Pregnancy history.  Screening You may have the following tests or measurements:  Height, weight, and BMI.  Blood pressure.  Lipid and cholesterol levels. These may be checked every 5 years, or more frequently if you are over 81 years old.  Skin check.  Lung cancer screening. You may have this screening every year starting at age 78 if you have a 30-pack-year history of smoking and currently smoke or have quit within the past 15 years.  Fecal occult blood test (FOBT) of the stool. You may have this test every year starting at age 65.  Flexible sigmoidoscopy or colonoscopy. You may have a sigmoidoscopy every 5 years or a colonoscopy  every 10 years starting at age 30.  Hepatitis C blood test.  Hepatitis B blood test.  Sexually transmitted disease (STD) testing.  Diabetes screening. This is done by checking your blood sugar (glucose) after you have not eaten for a while (fasting). You may have this done every 1-3 years.  Mammogram. This may be done every 1-2 years. Talk to your health care provider about when you should start having regular mammograms. This may depend on whether you have a family history of breast cancer.  BRCA-related cancer screening. This may be done if you have a family history of breast, ovarian, tubal, or peritoneal cancers.  Pelvic exam and Pap test. This may be done every 3 years starting at age 80. Starting at age 36, this may be done every 5 years if you have a Pap test in combination with an HPV test.  Bone density scan. This is done to screen for osteoporosis. You may have this scan if you are at high risk for osteoporosis.  Discuss your test results, treatment options, and if necessary, the need for more tests with your health care provider. Vaccines Your health care provider may recommend certain vaccines, such as:  Influenza vaccine. This is recommended every year.  Tetanus, diphtheria, and acellular pertussis (Tdap, Td) vaccine. You may need a Td booster every 10 years.  Varicella vaccine. You may need this if you have not been vaccinated.  Zoster vaccine. You may need this after age 5.  Measles, mumps, and rubella (MMR) vaccine. You may need at least one dose of MMR if you were born in  1957 or later. You may also need a second dose.  Pneumococcal 13-valent conjugate (PCV13) vaccine. You may need this if you have certain conditions and were not previously vaccinated.  Pneumococcal polysaccharide (PPSV23) vaccine. You may need one or two doses if you smoke cigarettes or if you have certain conditions.  Meningococcal vaccine. You may need this if you have certain  conditions.  Hepatitis A vaccine. You may need this if you have certain conditions or if you travel or work in places where you may be exposed to hepatitis A.  Hepatitis B vaccine. You may need this if you have certain conditions or if you travel or work in places where you may be exposed to hepatitis B.  Haemophilus influenzae type b (Hib) vaccine. You may need this if you have certain conditions.  Talk to your health care provider about which screenings and vaccines you need and how often you need them. This information is not intended to replace advice given to you by your health care provider. Make sure you discuss any questions you have with your health care provider. Document Released: 03/27/2015 Document Revised: 11/18/2015 Document Reviewed: 12/30/2014 Elsevier Interactive Patient Education  2018 Elsevier Inc.  

## 2017-07-05 NOTE — Addendum Note (Signed)
Addended by: Cheree DittoGRAHAM, Glynnis Gavel A on: 07/05/2017 03:06 PM   Modules accepted: Orders

## 2017-07-05 NOTE — Progress Notes (Signed)
LAST PAP 05/2015 MM- 02/2017 Subjective:     [name] is a 47 y.o. female and is here for a comprehensive physical exam. The patient reports problems - chronic pain. Complains of joint pain, in hips, low back. Has f/h of ankylosing spondylitis, but has been negatively tested for that. Has negative ANA and RF in 2014. Reports loss of ROM at hip. Continues to put on weight. She is s/p HTA and has light regular cycles which last approx. 7 days.  Social History   Socioeconomic History  . Marital status: Married    Spouse name: Not on file  . Number of children: Not on file  . Years of education: Not on file  . Highest education level: Not on file  Occupational History  . Not on file  Social Needs  . Financial resource strain: Not on file  . Food insecurity:    Worry: Not on file    Inability: Not on file  . Transportation needs:    Medical: Not on file    Non-medical: Not on file  Tobacco Use  . Smoking status: Never Smoker  . Smokeless tobacco: Never Used  Substance and Sexual Activity  . Alcohol use: Yes    Comment: rare  . Drug use: No  . Sexual activity: Yes    Partners: Male    Birth control/protection: Surgical  Lifestyle  . Physical activity:    Days per week: Not on file    Minutes per session: Not on file  . Stress: Not on file  Relationships  . Social connections:    Talks on phone: Not on file    Gets together: Not on file    Attends religious service: Not on file    Active member of club or organization: Not on file    Attends meetings of clubs or organizations: Not on file    Relationship status: Not on file  . Intimate partner violence:    Fear of current or ex partner: Not on file    Emotionally abused: Not on file    Physically abused: Not on file    Forced sexual activity: Not on file  Other Topics Concern  . Not on file  Social History Narrative  . Not on file   Health Maintenance  Topic Date Due  . HIV Screening  04/22/1985  .  TETANUS/TDAP  04/22/1989  . INFLUENZA VACCINE  10/12/2017  . PAP SMEAR  06/04/2018    The following portions of the patient's history were reviewed and updated as appropriate: allergies, current medications, past family history, past medical history, past social history, past surgical history and problem list.  Review of Systems Pertinent items noted in HPI and remainder of comprehensive ROS otherwise negative.   Objective:    BP 122/82   Pulse 90   Wt 235 lb (106.6 kg)   BMI 40.34 kg/m  General appearance: alert, cooperative and appears stated age Head: Normocephalic, without obvious abnormality, atraumatic Neck: no adenopathy, supple, symmetrical, trachea midline and thyroid not enlarged, symmetric, no tenderness/mass/nodules Lungs: clear to auscultation bilaterally Breasts: normal appearance, no masses or tenderness Heart: regular rate and rhythm, S1, S2 normal, no murmur, click, rub or gallop Abdomen: soft, non-tender; bowel sounds normal; no masses,  no organomegaly Pelvic: cervix normal in appearance, external genitalia normal, no adnexal masses or tenderness, no cervical motion tenderness, uterus normal size, shape, and consistency and vagina normal without discharge Extremities: extremities normal, atraumatic, no cyanosis or edema Pulses: 2+ and  symmetric Skin: Skin color, texture, turgor normal. No rashes or lesions Lymph nodes: Cervical, supraclavicular, and axillary nodes normal. Neurologic: Grossly normal    Assessment:    GYN female exam.      Plan:      Problem List Items Addressed This Visit    None    Visit Diagnoses    Pain of both hip joints    -  Primary   Repeat ANA (previously 1:40) and RF   Relevant Orders   Antinuclear Antib (ANA)   Rheumatoid Factor   Screening for malignant neoplasm of cervix       Encounter for gynecological examination without abnormal finding       Relevant Orders   CBC   TSH   Comprehensive metabolic panel   Lipid  panel   HIV antibody     Return in 1 year (on 07/06/2018).  See After Visit Summary for Counseling Recommendations

## 2017-07-06 LAB — COMPREHENSIVE METABOLIC PANEL
ALT: 20 IU/L (ref 0–32)
AST: 21 IU/L (ref 0–40)
Albumin/Globulin Ratio: 1.4 (ref 1.2–2.2)
Albumin: 3.7 g/dL (ref 3.5–5.5)
Alkaline Phosphatase: 83 IU/L (ref 39–117)
BUN/Creatinine Ratio: 23 (ref 9–23)
BUN: 18 mg/dL (ref 6–24)
Bilirubin Total: 0.3 mg/dL (ref 0.0–1.2)
CALCIUM: 9 mg/dL (ref 8.7–10.2)
CO2: 23 mmol/L (ref 20–29)
Chloride: 102 mmol/L (ref 96–106)
Creatinine, Ser: 0.79 mg/dL (ref 0.57–1.00)
GFR calc Af Amer: 103 mL/min/{1.73_m2} (ref >59)
GFR, EST NON AFRICAN AMERICAN: 89 mL/min/{1.73_m2} (ref >59)
GLUCOSE: 87 mg/dL (ref 65–99)
Globulin, Total: 2.6 g/dL (ref 1.5–4.5)
Potassium: 4.4 mmol/L (ref 3.5–5.2)
Sodium: 140 mmol/L (ref 134–144)
Total Protein: 6.3 g/dL (ref 6.0–8.5)

## 2017-07-06 LAB — CBC
HEMOGLOBIN: 13.4 g/dL (ref 11.1–15.9)
Hematocrit: 40.5 % (ref 34.0–46.6)
MCH: 30.4 pg (ref 26.6–33.0)
MCHC: 33.1 g/dL (ref 31.5–35.7)
MCV: 92 fL (ref 79–97)
Platelets: 266 10*3/uL (ref 150–379)
RBC: 4.41 x10E6/uL (ref 3.77–5.28)
RDW: 14.5 % (ref 12.3–15.4)
WBC: 6.1 10*3/uL (ref 3.4–10.8)

## 2017-07-06 LAB — LIPID PANEL
CHOL/HDL RATIO: 3.7 ratio (ref 0.0–4.4)
Cholesterol, Total: 162 mg/dL (ref 100–199)
HDL: 44 mg/dL (ref >39)
LDL Calculated: 92 mg/dL (ref 0–99)
Triglycerides: 132 mg/dL (ref 0–149)
VLDL CHOLESTEROL CAL: 26 mg/dL (ref 5–40)

## 2017-07-06 LAB — HIV ANTIBODY (ROUTINE TESTING W REFLEX): HIV SCREEN 4TH GENERATION: NONREACTIVE

## 2017-07-06 LAB — RHEUMATOID FACTOR: Rhuematoid fact SerPl-aCnc: <10 IU/mL (ref 0.0–13.9)

## 2017-07-06 LAB — ANA: Anti Nuclear Antibody(ANA): POSITIVE — AB

## 2017-07-06 LAB — TSH: TSH: 1.31 u[IU]/mL (ref 0.450–4.500)

## 2017-07-11 LAB — CYTOLOGY - PAP
Diagnosis: NEGATIVE
HPV (WINDOPATH): NOT DETECTED

## 2018-05-02 ENCOUNTER — Encounter: Payer: Self-pay | Admitting: Radiology

## 2018-05-23 ENCOUNTER — Telehealth: Payer: Self-pay

## 2018-05-23 NOTE — Telephone Encounter (Signed)
Wrong chart

## 2019-11-07 ENCOUNTER — Ambulatory Visit: Payer: Managed Care, Other (non HMO) | Admitting: Family Medicine

## 2019-11-21 ENCOUNTER — Encounter: Payer: Self-pay | Admitting: Family Medicine

## 2019-11-21 ENCOUNTER — Ambulatory Visit (INDEPENDENT_AMBULATORY_CARE_PROVIDER_SITE_OTHER): Payer: 59 | Admitting: Family Medicine

## 2019-11-21 ENCOUNTER — Other Ambulatory Visit: Payer: Self-pay

## 2019-11-21 ENCOUNTER — Other Ambulatory Visit (HOSPITAL_COMMUNITY)
Admission: RE | Admit: 2019-11-21 | Discharge: 2019-11-21 | Disposition: A | Discharge status: Home / Self Care | Payer: 59 | Source: Ambulatory Visit | Attending: Family Medicine | Admitting: Family Medicine

## 2019-11-21 DIAGNOSIS — Z01419 Encounter for gynecological examination (general) (routine) without abnormal findings: Secondary | ICD-10-CM

## 2019-11-21 DIAGNOSIS — Z124 Encounter for screening for malignant neoplasm of cervix: Secondary | ICD-10-CM

## 2019-11-21 NOTE — Progress Notes (Signed)
  Subjective:     [name] is a 49 y.o. female and is here for a comprehensive physical exam. The patient reports no problems. Still with occasional cycle. Having some leaking of urine and bulge in pelvic area. Not really interested in hyst or Uro/BYN referral at this time.   The following portions of the patient's history were reviewed and updated as appropriate: allergies, current medications, past family history, past medical history, past social history, past surgical history and problem list.  Review of Systems Pertinent items noted in HPI and remainder of comprehensive ROS otherwise negative.   Objective:    BP 130/82   Pulse 81   Ht 5\' 4"  (1.626 m)   Wt 234 lb 3.2 oz (106.2 kg)   LMP 11/03/2019 (Approximate)   BMI 40.20 kg/m  General appearance: alert, cooperative, appears stated age and moderately obese Head: Normocephalic, without obvious abnormality, atraumatic Neck: no adenopathy, supple, symmetrical, trachea midline and thyroid not enlarged, symmetric, no tenderness/mass/nodules Lungs: clear to auscultation bilaterally Breasts: normal appearance, no masses or tenderness Heart: regular rate and rhythm, S1, S2 normal, no murmur, click, rub or gallop Abdomen: soft, non-tender; bowel sounds normal; no masses,  no organomegaly Pelvic: cervix normal in appearance, external genitalia normal, no adnexal masses or tenderness, no cervical motion tenderness, uterus normal size, shape, and consistency, vagina normal without discharge and small rectocele Extremities: extremities normal, atraumatic, no cyanosis or edema Pulses: 2+ and symmetric Skin: Skin color, texture, turgor normal. No rashes or lesions Lymph nodes: Cervical, supraclavicular, and axillary nodes normal. Neurologic: Grossly normal    Assessment:    Healthy female exam.      Plan:   Problem List Items Addressed This Visit    None    Visit Diagnoses    Screening for malignant neoplasm of cervix        Relevant Orders   Cytology - PAP( [place])   Encounter for gynecological examination without abnormal finding       Relevant Orders   MM 3D SCREEN BREAST BILATERAL     Return in 1 year (on 11/20/2020).    See After Visit Summary for Counseling Recommendations

## 2019-11-21 NOTE — Progress Notes (Signed)
Pt. Presents for annual exam, last pap: 07/05/2017 Declines flu vaccine at this time.

## 2019-11-25 ENCOUNTER — Encounter: Payer: Self-pay | Admitting: Family Medicine

## 2019-11-25 LAB — CYTOLOGY - PAP
Adequacy: ABSENT
Comment: NEGATIVE
Diagnosis: NEGATIVE
High risk HPV: NEGATIVE

## 2019-12-09 ENCOUNTER — Ambulatory Visit
Admission: RE | Admit: 2019-12-09 | Discharge: 2019-12-09 | Disposition: A | Discharge status: Home / Self Care | Payer: 59 | Source: Ambulatory Visit | Attending: Family Medicine | Admitting: Family Medicine

## 2019-12-09 ENCOUNTER — Other Ambulatory Visit: Payer: Self-pay

## 2019-12-09 DIAGNOSIS — Z1231 Encounter for screening mammogram for malignant neoplasm of breast: Secondary | ICD-10-CM | POA: Diagnosis present

## 2019-12-09 DIAGNOSIS — Z01419 Encounter for gynecological examination (general) (routine) without abnormal findings: Secondary | ICD-10-CM

## 2020-04-24 ENCOUNTER — Ambulatory Visit
Admission: RE | Admit: 2020-04-24 | Discharge: 2020-04-24 | Disposition: A | Discharge status: Home / Self Care | Payer: 59 | Source: Ambulatory Visit | Attending: Rheumatology | Admitting: Rheumatology

## 2020-04-24 ENCOUNTER — Other Ambulatory Visit: Payer: Self-pay | Admitting: Rheumatology

## 2020-04-24 ENCOUNTER — Other Ambulatory Visit: Payer: Self-pay

## 2020-04-24 ENCOUNTER — Ambulatory Visit
Admission: RE | Admit: 2020-04-24 | Discharge: 2020-04-24 | Disposition: A | Discharge status: Home / Self Care | Payer: 59 | Attending: Rheumatology | Admitting: Rheumatology

## 2020-04-24 DIAGNOSIS — R52 Pain, unspecified: Secondary | ICD-10-CM | POA: Diagnosis not present

## 2020-10-14 ENCOUNTER — Encounter: Payer: Self-pay | Admitting: Radiology

## 2021-08-11 ENCOUNTER — Encounter: Payer: Self-pay | Admitting: Family Medicine

## 2021-08-11 ENCOUNTER — Ambulatory Visit (INDEPENDENT_AMBULATORY_CARE_PROVIDER_SITE_OTHER): Payer: BC Managed Care – PPO | Admitting: Family Medicine

## 2021-08-11 ENCOUNTER — Other Ambulatory Visit (HOSPITAL_COMMUNITY)
Admission: EM | Admit: 2021-08-11 | Discharge: 2021-08-11 | Disposition: A | Discharge status: Home / Self Care | Payer: BC Managed Care – PPO | Source: Ambulatory Visit | Attending: Family Medicine | Admitting: Family Medicine

## 2021-08-11 VITALS — BP 116/81 | HR 74 | Ht 64.0 in | Wt 231.0 lb

## 2021-08-11 DIAGNOSIS — N393 Stress incontinence (female) (male): Secondary | ICD-10-CM | POA: Diagnosis not present

## 2021-08-11 DIAGNOSIS — Z01411 Encounter for gynecological examination (general) (routine) with abnormal findings: Secondary | ICD-10-CM

## 2021-08-11 DIAGNOSIS — Z124 Encounter for screening for malignant neoplasm of cervix: Secondary | ICD-10-CM | POA: Insufficient documentation

## 2021-08-11 DIAGNOSIS — Z1231 Encounter for screening mammogram for malignant neoplasm of breast: Secondary | ICD-10-CM | POA: Diagnosis not present

## 2021-08-11 NOTE — Progress Notes (Signed)
Subjective:     [name] is a 51 y.o. female and is here for a comprehensive physical exam. The patient reports problems - skipped a cycle in Feb. She has had a endometrial ablation and cycles are not heavy.  Notes a lot of bladder leaking. Leaks with sneezing and laughing. No urge incontinence. She is ready to try something for this.    The following portions of the patient's history were reviewed and updated as appropriate: allergies, current medications, past family history, past medical history, past social history, past surgical history, and problem list.  Review of Systems Pertinent items noted in HPI and remainder of comprehensive ROS otherwise negative.   Objective:    BP 116/81   Pulse 74   Ht 5\' 4"  (1.626 m)   Wt 231 lb (104.8 kg)   LMP 07/26/2021 (Approximate)   BMI 39.65 kg/m  General appearance: alert, cooperative, and appears stated age Head: Normocephalic, without obvious abnormality, atraumatic Neck: no adenopathy, supple, symmetrical, trachea midline, and thyroid not enlarged, symmetric, no tenderness/mass/nodules Lungs: clear to auscultation bilaterally Breasts: normal appearance, no masses or tenderness Heart: regular rate and rhythm, S1, S2 normal, no murmur, click, rub or gallop Abdomen: soft, non-tender; bowel sounds normal; no masses,  no organomegaly Pelvic: cervix normal in appearance, external genitalia normal, no adnexal masses or tenderness, no cervical motion tenderness, uterus normal size, shape, and consistency, and vagina normal without discharge Extremities: extremities normal, atraumatic, no cyanosis or edema Pulses: 2+ and symmetric Skin: Skin color, texture, turgor normal. No rashes or lesions Lymph nodes: Cervical, supraclavicular, and axillary nodes normal. Neurologic: Grossly normal    Assessment:    Healthy female exam.      Plan:    Encounter for gynecological examination with abnormal finding - Plan: TSH, CBC, Comprehensive  metabolic panel, Hemoglobin A1c, Lipid panel  Stress incontinence - referral to uro/gyn - Plan: Ambulatory referral to Urogynecology  Encounter for screening mammogram for malignant neoplasm of breast - Plan: MM 3D SCREEN BREAST BILATERAL  Screening for cervical cancer - Plan: Cytology - PAP  Return in 1 year (on 08/12/2022) for urogyn referral.  See After Visit Summary for Counseling Recommendations

## 2021-08-11 NOTE — Progress Notes (Signed)
LMP:07/26/21 pt notes skipping a cycle in Feb 2023. Following cycle was heavy.  Last Mammogram: 12/09/19 Last Pap:11/21/2019  STD Screening:No Family Hx of Breast Cancer:No Family Hx of Ovarian Cancer:No  CC: Leaking bladder with everything, sneezing, laughing, coughing etc.  Pt has to wear a panty liner.

## 2021-08-12 LAB — COMPREHENSIVE METABOLIC PANEL
ALT: 19 IU/L (ref 0–32)
AST: 20 IU/L (ref 0–40)
Albumin/Globulin Ratio: 1.7 (ref 1.2–2.2)
Albumin: 4 g/dL (ref 3.8–4.9)
Alkaline Phosphatase: 89 IU/L (ref 44–121)
BUN/Creatinine Ratio: 19 (ref 9–23)
BUN: 16 mg/dL (ref 6–24)
Bilirubin Total: 0.4 mg/dL (ref 0.0–1.2)
CO2: 24 mmol/L (ref 20–29)
Calcium: 9.4 mg/dL (ref 8.7–10.2)
Chloride: 103 mmol/L (ref 96–106)
Creatinine, Ser: 0.84 mg/dL (ref 0.57–1.00)
Globulin, Total: 2.3 g/dL (ref 1.5–4.5)
Glucose: 85 mg/dL (ref 70–99)
Potassium: 4.5 mmol/L (ref 3.5–5.2)
Sodium: 140 mmol/L (ref 134–144)
Total Protein: 6.3 g/dL (ref 6.0–8.5)
eGFR: 84 mL/min/{1.73_m2} (ref >59)

## 2021-08-12 LAB — CBC
Hematocrit: 44.9 % (ref 34.0–46.6)
Hemoglobin: 15.2 g/dL (ref 11.1–15.9)
MCH: 31.4 pg (ref 26.6–33.0)
MCHC: 33.9 g/dL (ref 31.5–35.7)
MCV: 93 fL (ref 79–97)
Platelets: 285 10*3/uL (ref 150–450)
RBC: 4.84 x10E6/uL (ref 3.77–5.28)
RDW: 13.8 % (ref 11.7–15.4)
WBC: 7 10*3/uL (ref 3.4–10.8)

## 2021-08-12 LAB — CYTOLOGY - PAP
Comment: NEGATIVE
Diagnosis: NEGATIVE
High risk HPV: NEGATIVE

## 2021-08-12 LAB — LIPID PANEL
Chol/HDL Ratio: 4.2 ratio (ref 0.0–4.4)
Cholesterol, Total: 187 mg/dL (ref 100–199)
HDL: 45 mg/dL (ref >39)
LDL Chol Calc (NIH): 116 mg/dL — ABNORMAL HIGH (ref 0–99)
Triglycerides: 149 mg/dL (ref 0–149)
VLDL Cholesterol Cal: 26 mg/dL (ref 5–40)

## 2021-08-12 LAB — HEMOGLOBIN A1C
Est. average glucose Bld gHb Est-mCnc: 103 mg/dL
Hgb A1c MFr Bld: 5.2 % (ref 4.8–5.6)

## 2021-08-12 LAB — TSH: TSH: 1.32 u[IU]/mL (ref 0.450–4.500)

## 2021-09-08 ENCOUNTER — Ambulatory Visit
Admission: RE | Admit: 2021-09-08 | Discharge: 2021-09-08 | Disposition: A | Discharge status: Home / Self Care | Payer: BC Managed Care – PPO | Source: Ambulatory Visit | Attending: Family Medicine | Admitting: Family Medicine

## 2021-09-08 DIAGNOSIS — Z1231 Encounter for screening mammogram for malignant neoplasm of breast: Secondary | ICD-10-CM | POA: Insufficient documentation

## 2022-06-15 ENCOUNTER — Ambulatory Visit: Payer: BC Managed Care – PPO | Admitting: Family Medicine

## 2022-09-16 ENCOUNTER — Ambulatory Visit (INDEPENDENT_AMBULATORY_CARE_PROVIDER_SITE_OTHER): Payer: BC Managed Care – PPO | Admitting: Family Medicine

## 2022-09-16 ENCOUNTER — Encounter: Payer: Self-pay | Admitting: Family Medicine

## 2022-09-16 ENCOUNTER — Other Ambulatory Visit (HOSPITAL_COMMUNITY): Admission: RE | Admit: 2022-09-16 | Payer: BC Managed Care – PPO | Source: Ambulatory Visit | Admitting: Family Medicine

## 2022-09-16 VITALS — BP 117/78 | HR 73 | Wt 211.0 lb

## 2022-09-16 DIAGNOSIS — Z1339 Encounter for screening examination for other mental health and behavioral disorders: Secondary | ICD-10-CM | POA: Diagnosis not present

## 2022-09-16 DIAGNOSIS — Z124 Encounter for screening for malignant neoplasm of cervix: Secondary | ICD-10-CM | POA: Diagnosis not present

## 2022-09-16 DIAGNOSIS — Z01419 Encounter for gynecological examination (general) (routine) without abnormal findings: Secondary | ICD-10-CM | POA: Diagnosis not present

## 2022-09-16 DIAGNOSIS — Z77011 Contact with and (suspected) exposure to lead: Secondary | ICD-10-CM | POA: Diagnosis not present

## 2022-09-16 DIAGNOSIS — Z780 Asymptomatic menopausal state: Secondary | ICD-10-CM

## 2022-09-16 NOTE — Progress Notes (Deleted)
Patient presents for Annual.  LMP: *** Last pap: Date: 11/21/2019 Contraception: {Blank single:19197::"OCP","POP","Nuvaring","Patch","Depo","Nexplanon","IUD: ***","Natural family planning","None","Not sexually active","Post-menopausal","Not indicated","***"} Mammogram: Due, last mammogram: 09/08/21 Colonoscopy: ?  STD Screening: {Blank single:19197::"Accepts","Declines","not indicated","***"} Flu Vaccine : N/A  CC: {Blank single:19197::"Annual/None","Annual and ***","***"}  Fun Fact: ***

## 2022-09-16 NOTE — Progress Notes (Signed)
Subjective:     [name] is a 52 y.o. female and is here for a comprehensive physical exam. The patient reports problems - having some weight gain, hot flashes, occasional difficulty with sleeping. She reports cycles are coming somewhat irregularly.  The following portions of the patient's history were reviewed and updated as appropriate: allergies, current medications, past family history, past medical history, past social history, past surgical history, and problem list.  Review of Systems Pertinent items noted in HPI and remainder of comprehensive ROS otherwise negative.   Objective:  Chaperone present for exam   BP 117/78   Pulse 73   Wt 211 lb (95.7 kg)   BMI 36.22 kg/m  General appearance: alert, cooperative, and appears stated age Head: Normocephalic, without obvious abnormality, atraumatic Neck: no adenopathy, supple, symmetrical, trachea midline, and thyroid not enlarged, symmetric, no tenderness/mass/nodules Lungs: clear to auscultation bilaterally Breasts: normal appearance, no masses or tenderness Heart: regular rate and rhythm, S1, S2 normal, no murmur, click, rub or gallop Abdomen: soft, non-tender; bowel sounds normal; no masses,  no organomegaly Pelvic: cervix normal in appearance, external genitalia normal, no adnexal masses or tenderness, no cervical motion tenderness, uterus normal size, shape, and consistency, and vagina normal without discharge Extremities: extremities normal, atraumatic, no cyanosis or edema Pulses: 2+ and symmetric Skin: Skin color, texture, turgor normal. No rashes or lesions Lymph nodes: Cervical, supraclavicular, and axillary nodes normal. Neurologic: Grossly normal    Assessment:    Healthy female exam.      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 - Plan: LEAD STANDARD PROFILE, BLOOD  Return in 1 year (on 09/16/2023).    See After Visit Summary for Counseling Recommendations

## 2022-09-16 NOTE — Progress Notes (Signed)
Patient presents for Annual.  Wants annual Labs and labs to check lead levels due to exposure with her work.  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

## 2022-09-17 LAB — COMPREHENSIVE METABOLIC PANEL
ALT: 21 IU/L (ref 0–32)
AST: 20 IU/L (ref 0–40)
Albumin: 3.9 g/dL (ref 3.8–4.9)
Alkaline Phosphatase: 98 IU/L (ref 44–121)
BUN/Creatinine Ratio: 23 (ref 9–23)
BUN: 21 mg/dL (ref 6–24)
Bilirubin Total: 0.3 mg/dL (ref 0.0–1.2)
CO2: 24 mmol/L (ref 20–29)
Calcium: 9.3 mg/dL (ref 8.7–10.2)
Chloride: 104 mmol/L (ref 96–106)
Creatinine, Ser: 0.92 mg/dL (ref 0.57–1.00)
Globulin, Total: 2.8 g/dL (ref 1.5–4.5)
Glucose: 76 mg/dL (ref 70–99)
Potassium: 4.4 mmol/L (ref 3.5–5.2)
Sodium: 142 mmol/L (ref 134–144)
Total Protein: 6.7 g/dL (ref 6.0–8.5)
eGFR: 75 mL/min/{1.73_m2} (ref >59)

## 2022-09-17 LAB — CBC
Hematocrit: 44.5 % (ref 34.0–46.6)
Hemoglobin: 14.5 g/dL (ref 11.1–15.9)
MCH: 30.5 pg (ref 26.6–33.0)
MCHC: 32.6 g/dL (ref 31.5–35.7)
MCV: 94 fL (ref 79–97)
Platelets: 261 10*3/uL (ref 150–450)
RBC: 4.75 x10E6/uL (ref 3.77–5.28)
RDW: 13.1 % (ref 11.7–15.4)
WBC: 5 10*3/uL (ref 3.4–10.8)

## 2022-09-17 LAB — HEMOGLOBIN A1C
Est. average glucose Bld gHb Est-mCnc: 105 mg/dL
Hgb A1c MFr Bld: 5.3 % (ref 4.8–5.6)

## 2022-09-17 LAB — LIPID PANEL
Chol/HDL Ratio: 3.5 ratio (ref 0.0–4.4)
Cholesterol, Total: 194 mg/dL (ref 100–199)
HDL: 56 mg/dL (ref >39)
LDL Chol Calc (NIH): 117 mg/dL — ABNORMAL HIGH (ref 0–99)
Triglycerides: 115 mg/dL (ref 0–149)
VLDL Cholesterol Cal: 21 mg/dL (ref 5–40)

## 2022-09-17 LAB — TSH: TSH: 1.52 u[IU]/mL (ref 0.450–4.500)

## 2022-09-17 LAB — FOLLICLE STIMULATING HORMONE: FSH: 39.6 m[IU]/mL

## 2022-09-17 LAB — VITAMIN D 25 HYDROXY (VIT D DEFICIENCY, FRACTURES): Vit D, 25-Hydroxy: 49.8 ng/mL (ref 30.0–100.0)

## 2022-09-19 LAB — LEAD STANDARD PROFILE, BLOOD
Lead, Blood (Adult): 2.6 ug/dL (ref 0.0–3.4)
Zinc Protoporphyrin (ZPP): 34 ug/dL (ref 0–99)

## 2022-09-20 LAB — CYTOLOGY - PAP
Comment: NEGATIVE
Diagnosis: NEGATIVE
Diagnosis: REACTIVE
High risk HPV: NEGATIVE

## 2022-10-05 ENCOUNTER — Ambulatory Visit: Payer: BC Managed Care – PPO

## 2022-10-26 ENCOUNTER — Ambulatory Visit
Admission: RE | Admit: 2022-10-26 | Discharge: 2022-10-26 | Disposition: A | Discharge status: Home / Self Care | Payer: BC Managed Care – PPO | Source: Ambulatory Visit | Attending: Family Medicine | Admitting: Family Medicine

## 2022-10-26 DIAGNOSIS — Z1231 Encounter for screening mammogram for malignant neoplasm of breast: Secondary | ICD-10-CM | POA: Insufficient documentation

## 2022-10-26 DIAGNOSIS — Z01419 Encounter for gynecological examination (general) (routine) without abnormal findings: Secondary | ICD-10-CM | POA: Diagnosis present

## 2023-01-21 IMAGING — CR DG HIP (WITH OR WITHOUT PELVIS) 2-3V*L*
2 series · 2 of 2 positions shown · non-contrast
Comparison: None.

CLINICAL DATA: Bilateral hip pain.

EXAM:
DG HIP (WITH OR WITHOUT PELVIS) 2-3V RIGHT; DG HIP (WITH OR WITHOUT
PELVIS) 2-3V LEFT

[hip ap]
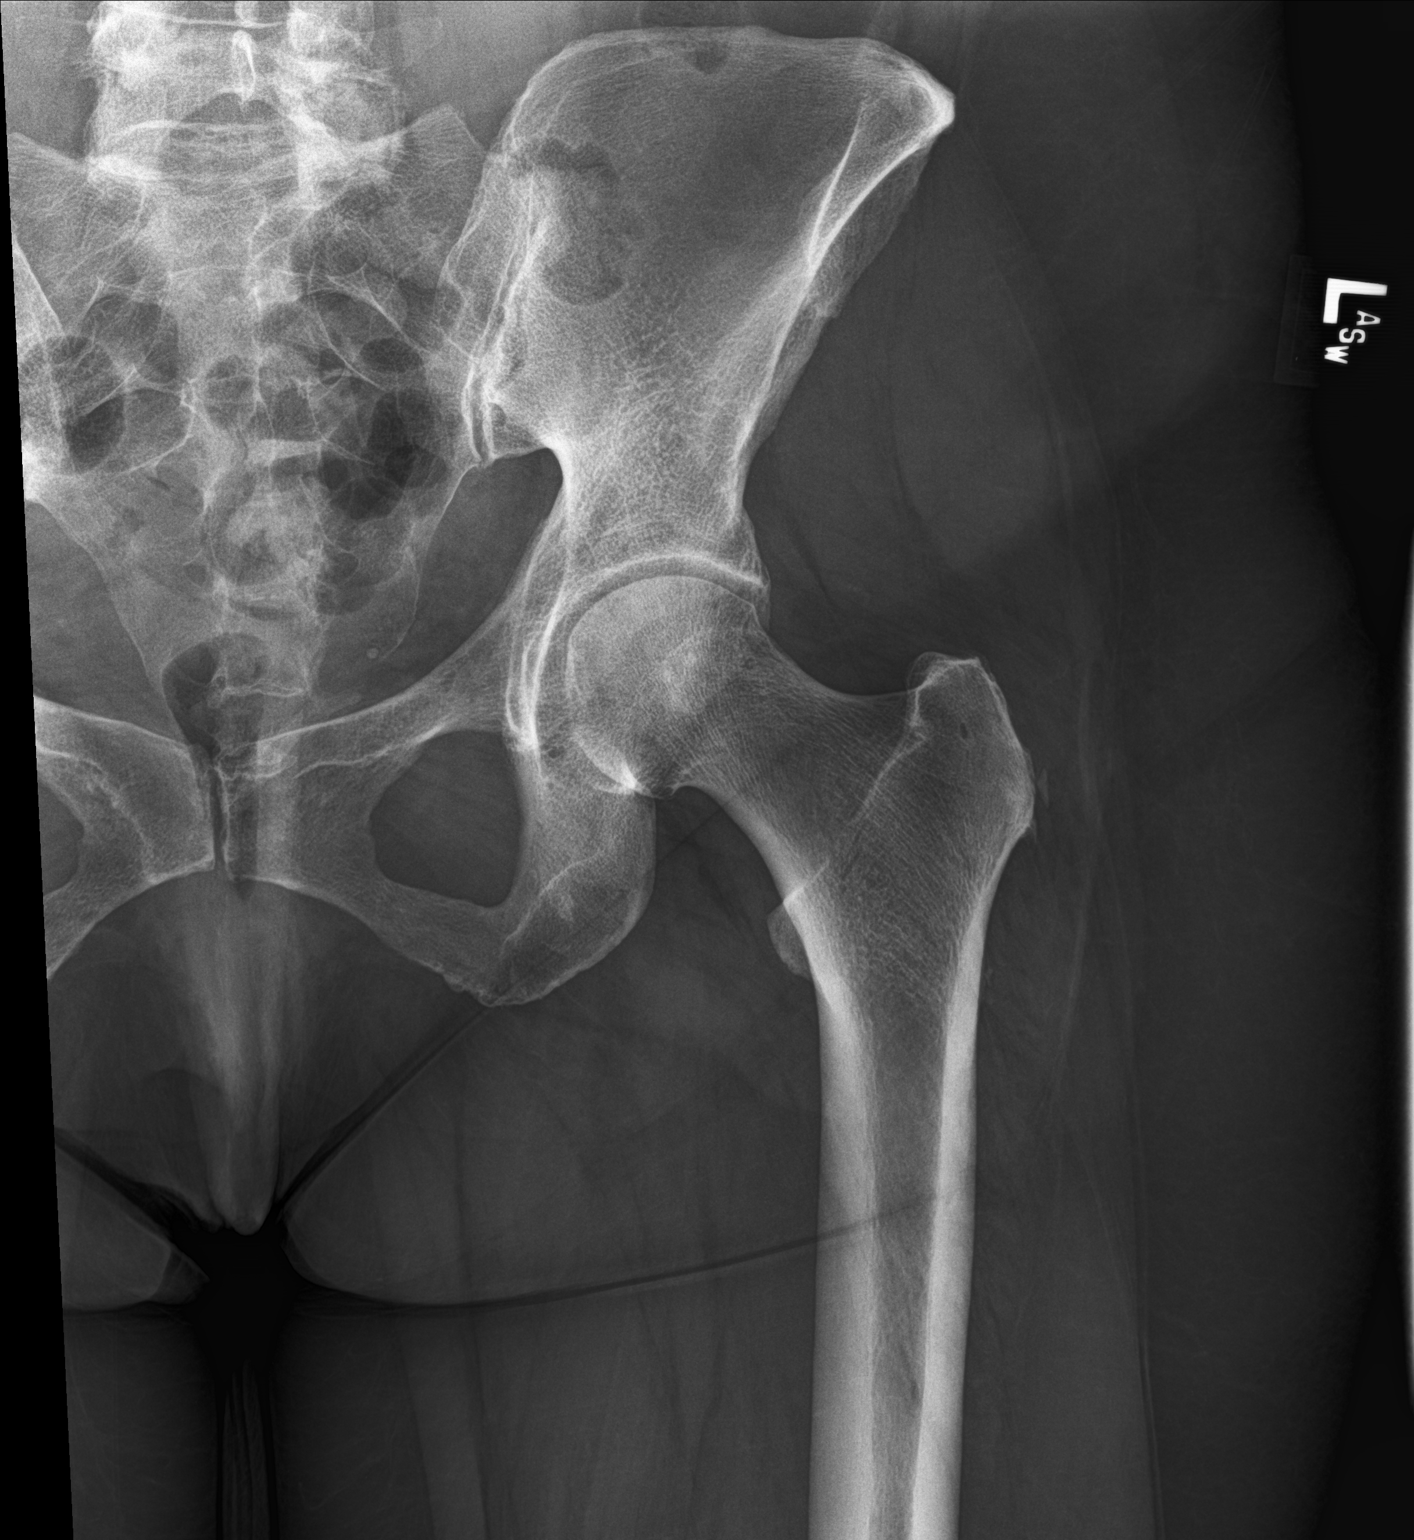

[hip lat]
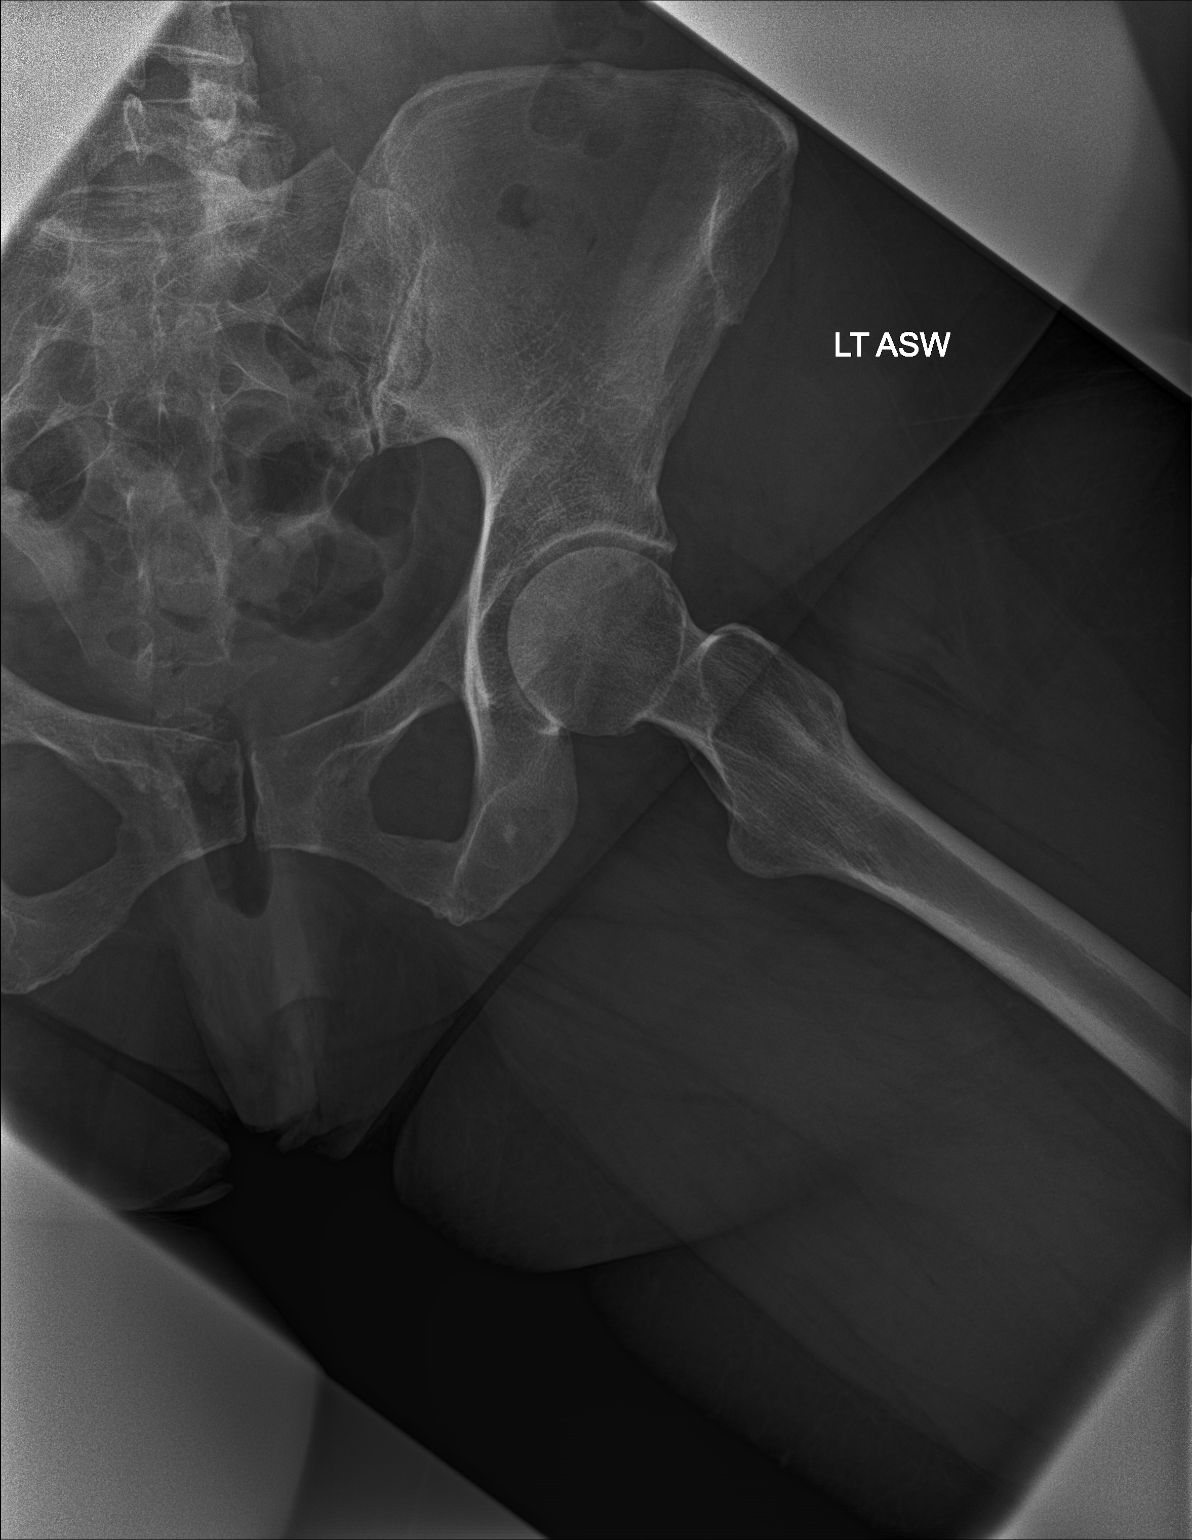

[2 of 2 positions shown; findings below may reference images not displayed]

FINDINGS: There is no acute displaced fracture or dislocation involving either
hip. There are mild-to-moderate degenerative changes of both hips.
There are calcifications projecting over the patient's pelvis there
are statistically most likely to represent phleboliths.
IMPRESSION: Mild-to-moderate degenerative changes of both hips.

## 2023-01-21 IMAGING — CR DG LUMBAR SPINE BEND(FLEX/EXT) ONLY 2-3 V
4 series · 4 of 4 positions shown · non-contrast
Comparison: June 01, 2017

CLINICAL DATA: Back pain

EXAM:
LUMBAR SPINE FLEX AND EXTEND ONLY - 2-3 VIEW

[l-spine lat]
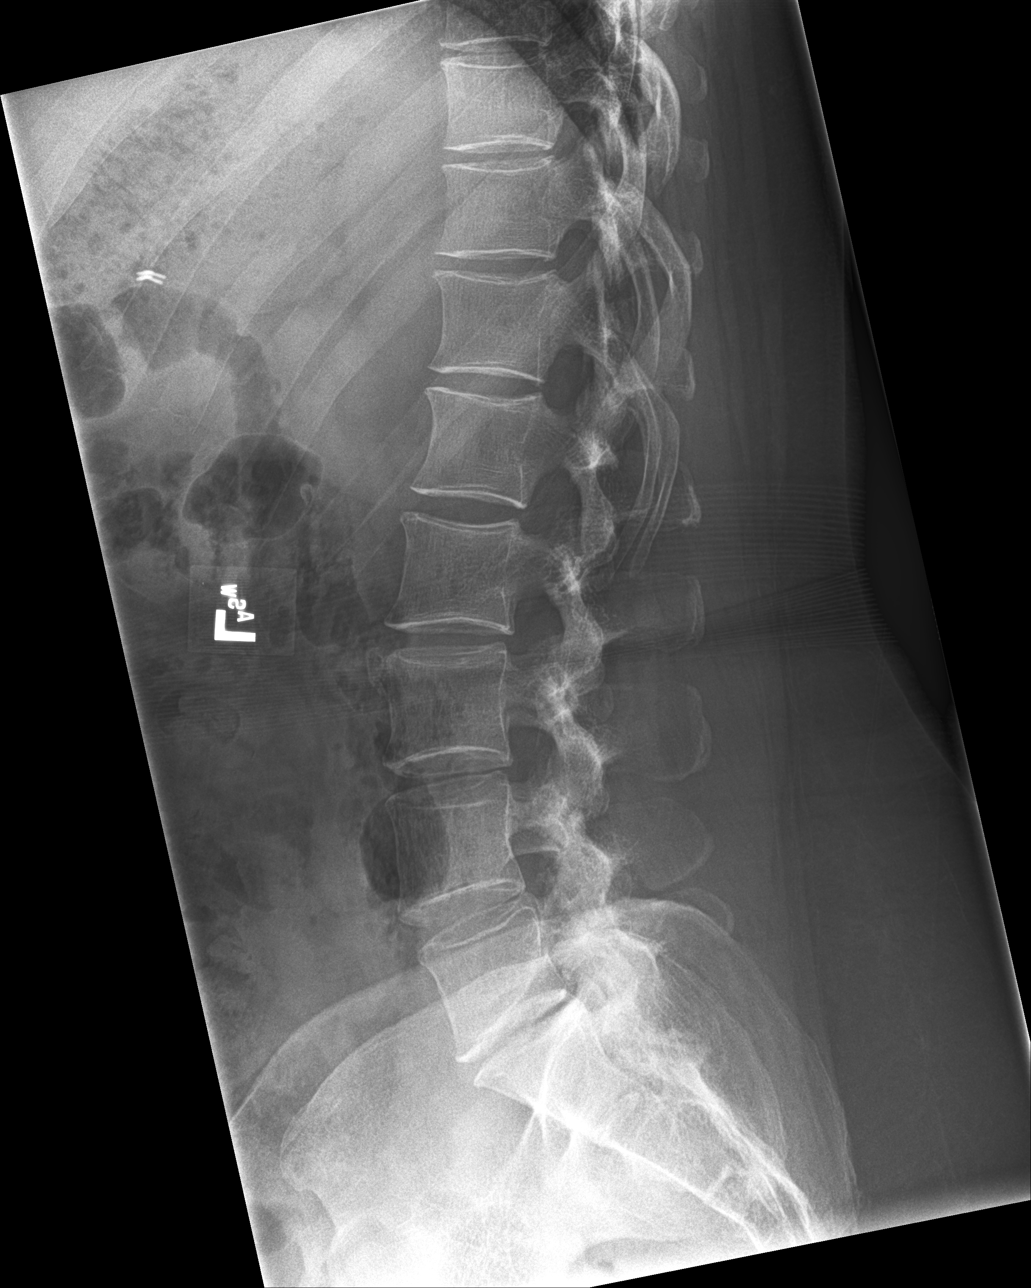

[l-spine flex]
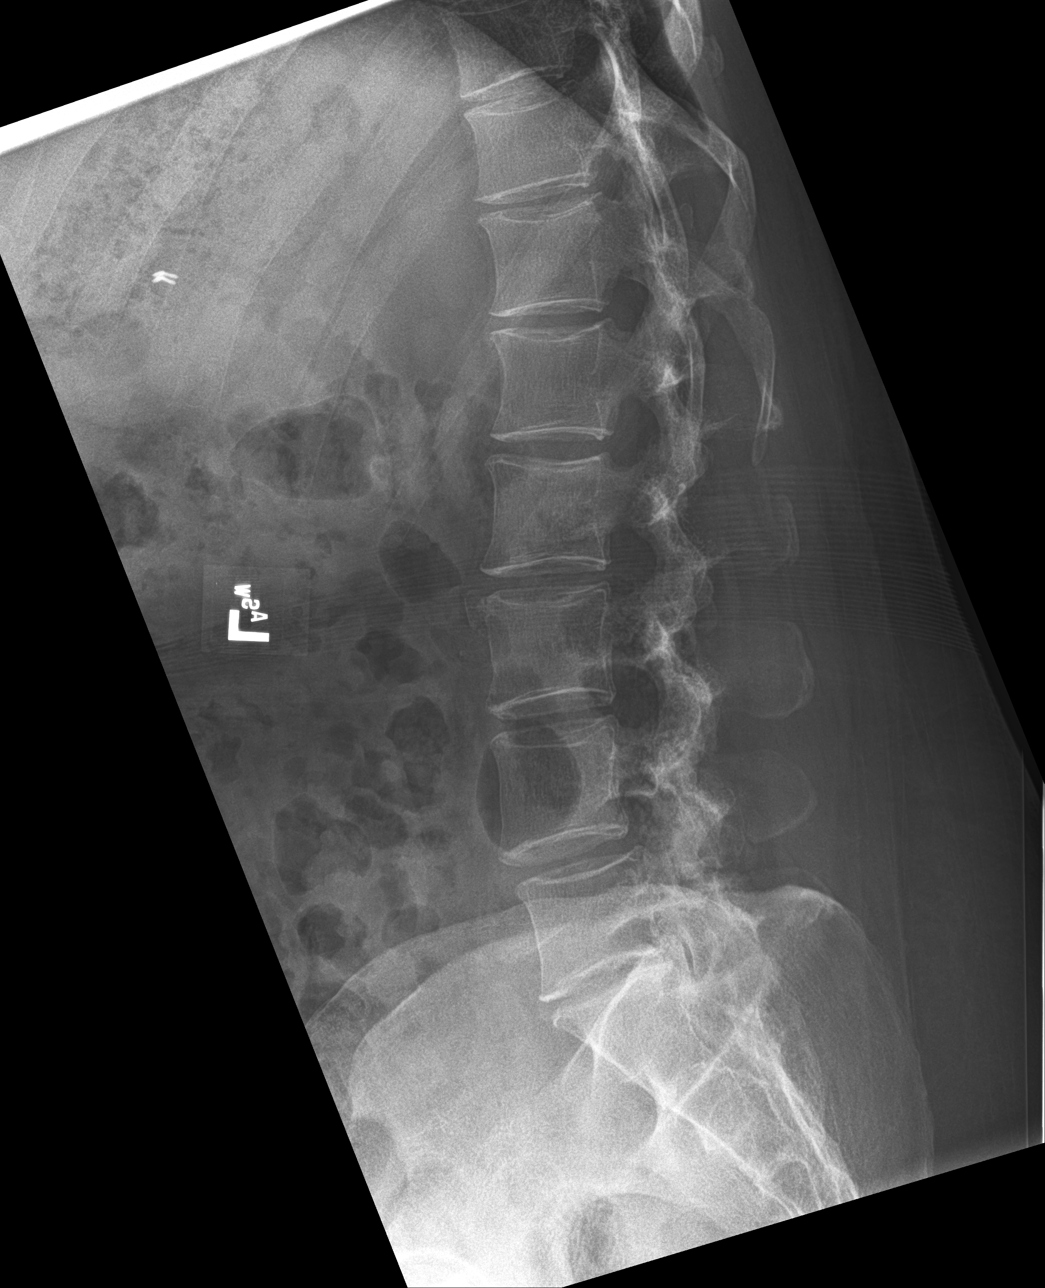

[l-spine ext]
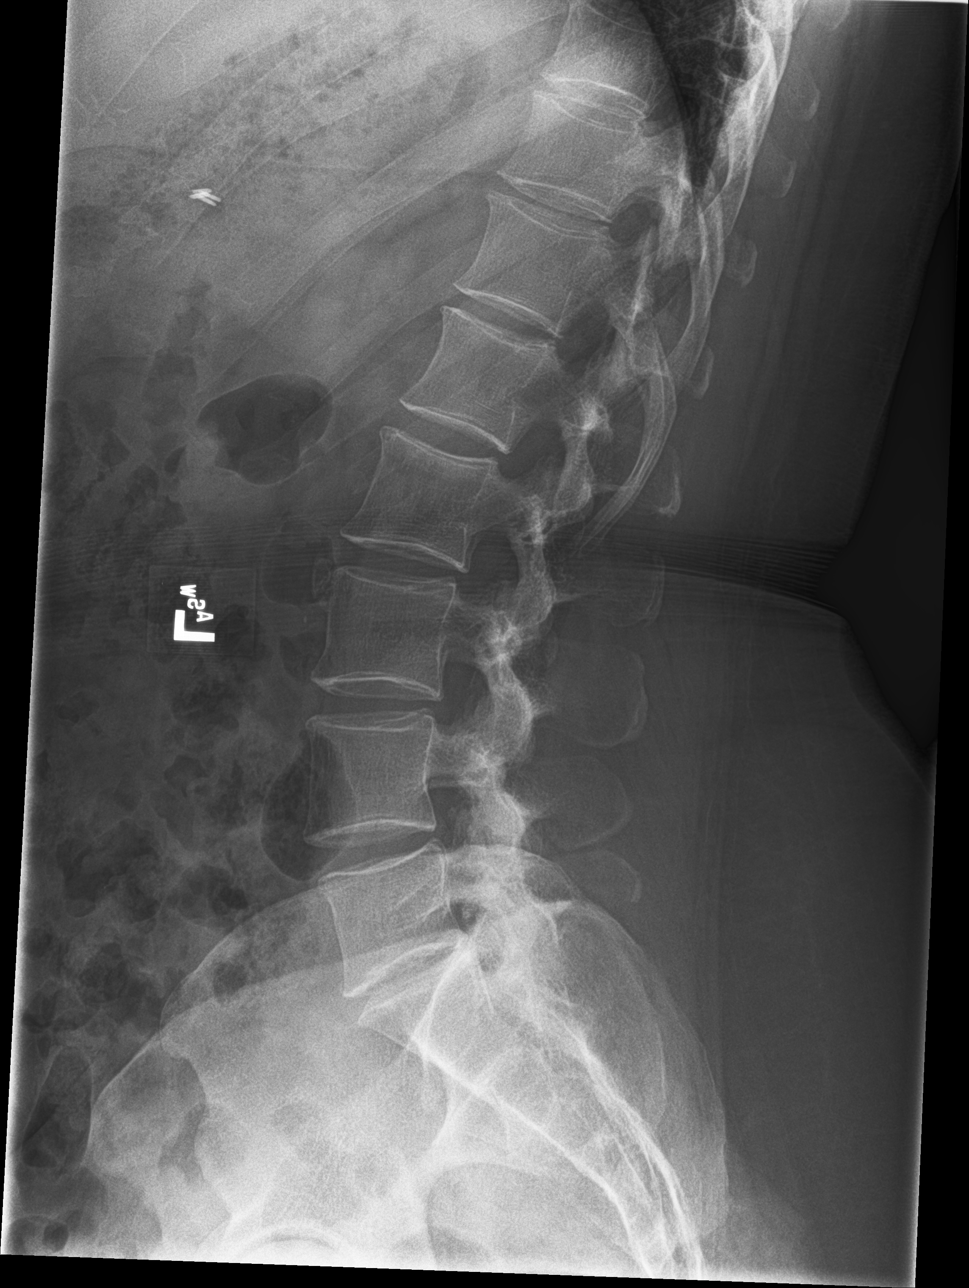

[l-spine spot]
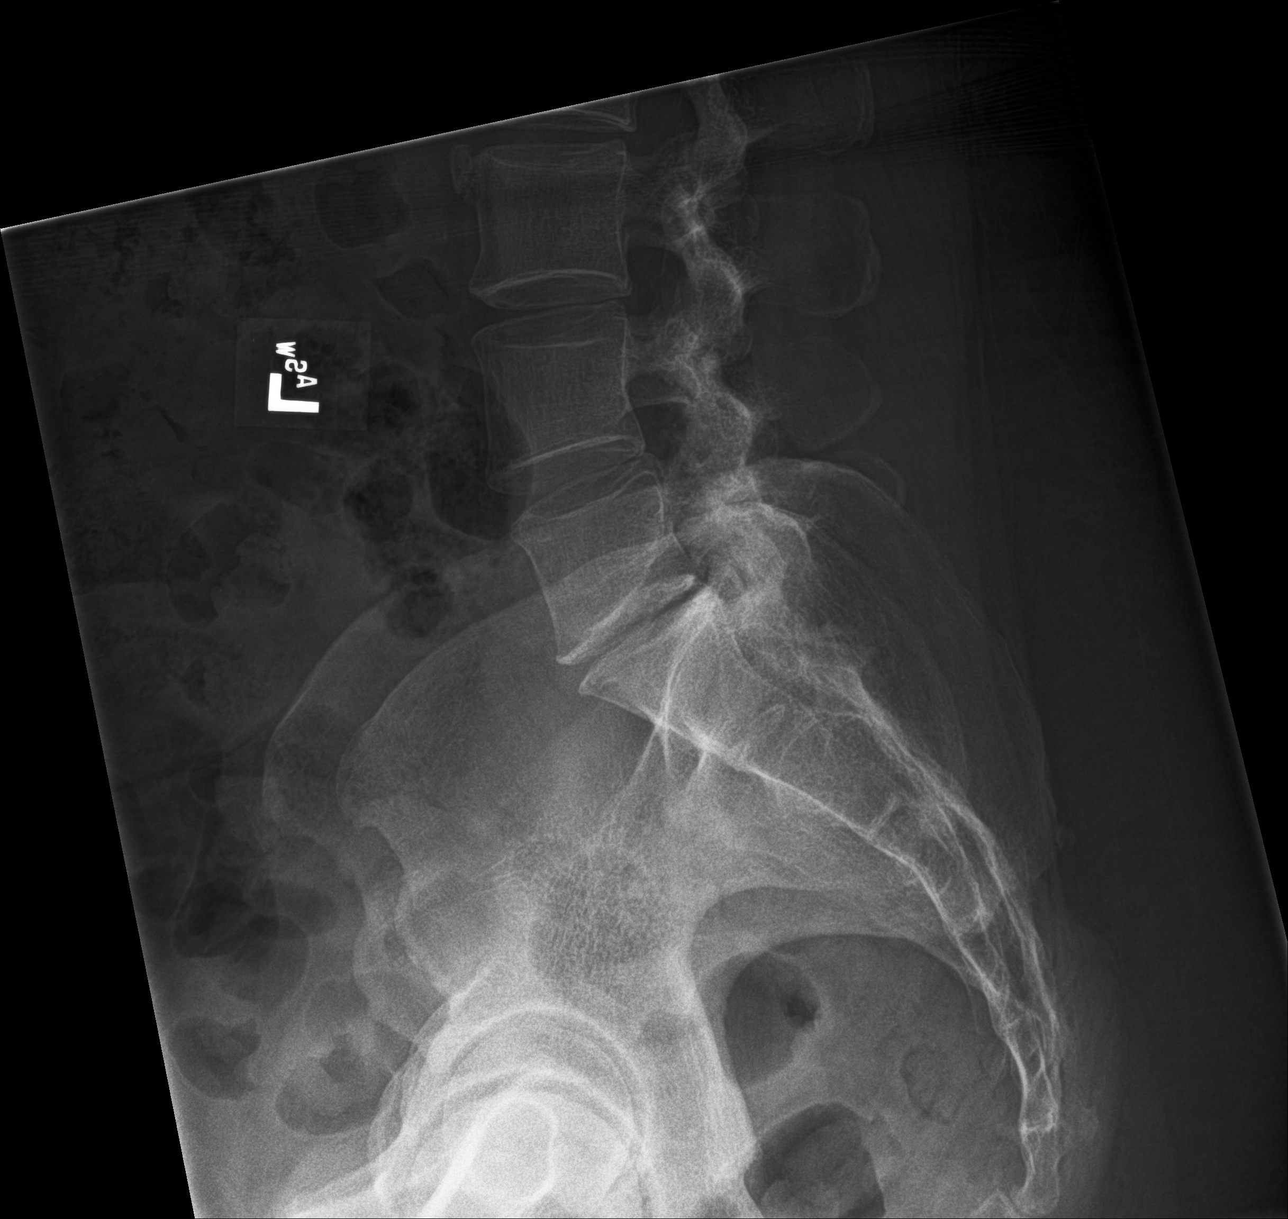

[4 of 4 positions shown; findings below may reference images not displayed]

FINDINGS: Mild-to-moderate degenerative changes are noted throughout the
lumbar spine, greatest at the L5-S1 level. There is facet arthrosis
in the lower lumbar segments. No acute compression fracture. No
significant malalignment. There is a 1-2 mm dynamic listhesis of L4
on L5, likely degenerative in etiology.
IMPRESSION: Mild-to-moderate degenerative changes of the lumbar spine, greatest
at the L5-S1 level.

## 2023-12-22 ENCOUNTER — Encounter: Payer: Self-pay | Admitting: Family Medicine

## 2023-12-22 ENCOUNTER — Other Ambulatory Visit (HOSPITAL_COMMUNITY)
Admission: RE | Admit: 2023-12-22 | Discharge: 2023-12-22 | Disposition: A | Discharge status: Home / Self Care | Source: Ambulatory Visit | Attending: Family Medicine | Admitting: Family Medicine

## 2023-12-22 ENCOUNTER — Ambulatory Visit (INDEPENDENT_AMBULATORY_CARE_PROVIDER_SITE_OTHER): Admitting: Family Medicine

## 2023-12-22 VITALS — BP 116/81 | HR 76 | Ht 64.0 in | Wt 221.0 lb

## 2023-12-22 DIAGNOSIS — Z01419 Encounter for gynecological examination (general) (routine) without abnormal findings: Secondary | ICD-10-CM | POA: Diagnosis not present

## 2023-12-22 DIAGNOSIS — Z124 Encounter for screening for malignant neoplasm of cervix: Secondary | ICD-10-CM

## 2023-12-22 DIAGNOSIS — Z1231 Encounter for screening mammogram for malignant neoplasm of breast: Secondary | ICD-10-CM

## 2023-12-22 NOTE — Progress Notes (Signed)
 Subjective:     [name] is a 53 y.o. female and is here for a comprehensive physical exam. The patient reports problems - sleeping. Having few and far between cycles. Has a lot of trouble sleeping and body aches. She is having night sweats.  The following portions of the patient's history were reviewed and updated as appropriate: allergies, current medications, past family history, past medical history, past social history, past surgical history, and problem list.  Review of Systems Pertinent items noted in HPI and remainder of comprehensive ROS otherwise negative.   Objective:  Chaperone present for exam   BP 116/81   Pulse 76   Ht 5' 4 (1.626 m)   Wt 221 lb (100.2 kg)   BMI 37.93 kg/m  General appearance: alert, cooperative, and appears stated age Head: Normocephalic, without obvious abnormality, atraumatic Neck: no adenopathy, supple, symmetrical, trachea midline, and thyroid not enlarged, symmetric, no tenderness/mass/nodules Lungs: clear to auscultation bilaterally Breasts: normal appearance, no masses or tenderness Heart: regular rate and rhythm, S1, S2 normal, no murmur, click, rub or gallop Abdomen: soft, non-tender; bowel sounds normal; no masses,  no organomegaly Pelvic: cervix normal in appearance, external genitalia normal, no adnexal masses or tenderness, no cervical motion tenderness, uterus normal size, shape, and consistency, and vagina normal without discharge Extremities: extremities normal, atraumatic, no cyanosis or edema Pulses: 2+ and symmetric Skin: Skin color, texture, turgor normal. No rashes or lesions Lymph nodes: Cervical, supraclavicular, and axillary nodes normal. Neurologic: Grossly normal    Assessment:    Healthy female exam.      Plan:  Screening for malignant neoplasm of cervix - Plan: Cytology - PAP  Encounter for gynecological examination without abnormal finding - Plan: CBC, TSH, Comprehensive metabolic panel with GFR, Lipid  panel, Hemoglobin A1c  Encounter for screening mammogram for malignant neoplasm of breast - Plan: MM 3D SCREENING MAMMOGRAM BILATERAL BREAST  Return in 1 year (on 12/21/2024).    See After Visit Summary for Counseling Recommendations

## 2023-12-22 NOTE — Progress Notes (Signed)
 Patient presents for Annual.  LMP: 1 day cycle 2 wks ago after having a cycle in feb.2025 Last pap: Date: 09/16/2022 BTL Mammogram: 10/26/2022 pt would like done at [facility] STD Screening: Declines Flu Vaccine : Declines  CC: Not able to sleep, taking HEMP Gummies, weight gain,hot flashes,joint/body aches.

## 2023-12-23 LAB — COMPREHENSIVE METABOLIC PANEL WITH GFR
ALT: 15 IU/L (ref 0–32)
AST: 19 IU/L (ref 0–40)
Albumin: 4.2 g/dL (ref 3.8–4.9)
Alkaline Phosphatase: 103 IU/L (ref 49–135)
BUN/Creatinine Ratio: 23 (ref 9–23)
BUN: 18 mg/dL (ref 6–24)
Bilirubin Total: 0.5 mg/dL (ref 0.0–1.2)
CO2: 26 mmol/L (ref 20–29)
Calcium: 9.5 mg/dL (ref 8.7–10.2)
Chloride: 101 mmol/L (ref 96–106)
Creatinine, Ser: 0.78 mg/dL (ref 0.57–1.00)
Globulin, Total: 2.7 g/dL (ref 1.5–4.5)
Glucose: 76 mg/dL (ref 70–99)
Potassium: 4.4 mmol/L (ref 3.5–5.2)
Sodium: 140 mmol/L (ref 134–144)
Total Protein: 6.9 g/dL (ref 6.0–8.5)
eGFR: 91 mL/min/1.73 (ref >59)

## 2023-12-23 LAB — CBC
Hematocrit: 49.1 % — ABNORMAL HIGH (ref 34.0–46.6)
Hemoglobin: 15.7 g/dL (ref 11.1–15.9)
MCH: 30.7 pg (ref 26.6–33.0)
MCHC: 32 g/dL (ref 31.5–35.7)
MCV: 96 fL (ref 79–97)
Platelets: 323 x10E3/uL (ref 150–450)
RBC: 5.12 x10E6/uL (ref 3.77–5.28)
RDW: 13.4 % (ref 11.7–15.4)
WBC: 6.5 x10E3/uL (ref 3.4–10.8)

## 2023-12-23 LAB — LIPID PANEL
Chol/HDL Ratio: 3.7 ratio (ref 0.0–4.4)
Cholesterol, Total: 191 mg/dL (ref 100–199)
HDL: 52 mg/dL (ref >39)
LDL Chol Calc (NIH): 118 mg/dL — ABNORMAL HIGH (ref 0–99)
Triglycerides: 119 mg/dL (ref 0–149)
VLDL Cholesterol Cal: 21 mg/dL (ref 5–40)

## 2023-12-23 LAB — HEMOGLOBIN A1C
Est. average glucose Bld gHb Est-mCnc: 97 mg/dL
Hgb A1c MFr Bld: 5 % (ref 4.8–5.6)

## 2023-12-23 LAB — TSH: TSH: 1.55 u[IU]/mL (ref 0.450–4.500)

## 2023-12-25 ENCOUNTER — Ambulatory Visit: Payer: Self-pay | Admitting: Family Medicine

## 2023-12-27 LAB — CYTOLOGY - PAP
Adequacy: ABSENT
Comment: NEGATIVE
Diagnosis: NEGATIVE
High risk HPV: NEGATIVE

## 2024-01-29 ENCOUNTER — Ambulatory Visit
Admission: RE | Admit: 2024-01-29 | Discharge: 2024-01-29 | Disposition: A | Discharge status: Home / Self Care | Source: Ambulatory Visit | Attending: Family Medicine | Admitting: Family Medicine

## 2024-01-29 DIAGNOSIS — Z1231 Encounter for screening mammogram for malignant neoplasm of breast: Secondary | ICD-10-CM | POA: Diagnosis present

## 2024-03-20 ENCOUNTER — Ambulatory Visit
Admission: EM | Admit: 2024-03-20 | Discharge: 2024-03-20 | Disposition: A | Discharge status: Home / Self Care | Attending: Emergency Medicine | Admitting: Emergency Medicine

## 2024-03-20 DIAGNOSIS — J069 Acute upper respiratory infection, unspecified: Secondary | ICD-10-CM

## 2024-03-20 MED ORDER — BENZONATATE 100 MG PO CAPS: 100.0000 mg | ORAL_CAPSULE | Freq: Three times a day (TID) | ORAL | 0 refills | Status: AC

## 2024-03-20 MED ORDER — AMOXICILLIN-POT CLAVULANATE 875-125 MG PO TABS: 1.0000 | ORAL_TABLET | Freq: Two times a day (BID) | ORAL | 0 refills | Status: AC

## 2024-03-20 NOTE — ED Provider Notes (Signed)
 " [facility]    CSN: [id number] Arrival date & time: 03/20/24  1511      History   Chief Complaint Chief Complaint  Patient presents with   Cough    HPI LIVIYA SANTINI is a 54 y.o. female.   Patient presents for evaluation of subjective fever, increased fatigue and malaise, nasal congestion, chest congestion, productive cough, bilateral ear fullness, frontal headache and shortness of breath with coughing present for 7 days.  Endorses mucus within the chest is deep and makes it difficult to expel.  Experiencing sore throat only from persistent coughing.  No known sick contacts prior.  Tolerable to food and liquids but appetite has been decreased today.  Has attempted use of NyQuil and DayQuil.  Denies presence of wheezing.  Denies respiratory history, non-smoker.   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   Obesity 04/17/2013   ADD (attention deficit disorder) 04/17/2013   Depression 04/17/2013    Past Surgical History:  Procedure Laterality Date   CHOLECYSTECTOMY     DILITATION & CURRETTAGE/HYSTROSCOPY WITH HYDROTHERMAL ABLATION N/A 07/31/2014   Procedure: DILATATION & CURETTAGE/HYSTEROSCOPY WITH HYDROTHERMAL ABLATION;  Surgeon: Glenys GORMAN Birk, MD;  Location: [facility];  Service: Gynecology;  Laterality: N/A;   TUBAL LIGATION     TUBAL LIGATION  2012   removal of left fallopian tube   WISDOM TOOTH EXTRACTION Bilateral     OB History     Gravida  3   Para  2   Term      Preterm      AB  1   Living  2      SAB  1   IAB      Ectopic      Multiple      Live Births               Home Medications    Prior to Admission medications  Medication Sig Start Date End Date  Taking? Authorizing Provider  amphetamine-dextroamphetamine (ADDERALL) 5 MG tablet Take 5 mg by mouth daily.   Yes [provider]  meloxicam (MOBIC) 15 MG tablet Take 15 mg by mouth daily.  03/18/13  Yes [provider]  WELLBUTRIN XL 150 MG 24 hr tablet Take 150 mg by mouth daily.  04/02/13  Yes [provider]  ALPRAZolam (XANAX) 0.25 MG tablet Take 1 tablet by mouth as needed. 06/18/15   [provider]  glucosamine-chondroitin 500-400 MG tablet Take 1 tablet by mouth daily.    [provider]  Multiple Vitamins-Minerals (MULTIVITAMIN PO) Take 1 tablet by mouth daily.    [provider]  semaglutide-weight management (WEGOVY) 0.5 MG/0.5ML SOAJ SQ injection Inject 0.5 mg into the skin. 12/12/23 06/09/24  [provider]  traZODone (DESYREL) 50 MG tablet Take 50 mg by mouth at bedtime. Takes half. Patient not taking: Reported on 12/22/2023    [provider]    Family History Family History  Problem Relation Age of Onset   Arthritis Mother    Heart disease Mother    Hypertension Mother    Arthritis Father    Diabetes Father    Heart  disease Father    Hypertension Father    Varicose Veins Father    Ankylosing spondylitis Father    Heart failure Father    Arthritis Maternal Grandmother    Depression Maternal Grandmother    Breast cancer Maternal Grandmother 52   Stroke Maternal Grandfather    Arthritis Paternal Grandmother    Diabetes Paternal Grandmother    Varicose Veins Paternal Grandmother    Stroke Paternal Grandmother     Social History Social History[1]   Allergies   Patient has no known allergies.   Review of Systems Review of Systems  Constitutional:  Positive for fatigue and fever. Negative for activity change, appetite change, chills, diaphoresis and unexpected weight change.  HENT:  Positive for congestion. Negative for dental problem, drooling, ear discharge, ear pain, facial swelling, hearing  loss, mouth sores, nosebleeds, postnasal drip, rhinorrhea, sinus pressure, sinus pain, sneezing, sore throat, tinnitus, trouble swallowing and voice change.   Respiratory:  Positive for cough and shortness of breath. Negative for apnea, choking, chest tightness, wheezing and stridor.      Physical Exam Triage Vital Signs ED Triage Vitals  Encounter Vitals Group     BP 03/20/24 1635 (!) 157/92     Girls Systolic BP Percentile --      Girls Diastolic BP Percentile --      Boys Systolic BP Percentile --      Boys Diastolic BP Percentile --      Pulse Rate 03/20/24 1635 86     Resp 03/20/24 1635 17     Temp 03/20/24 1635 99.1 F (37.3 C)     Temp Source 03/20/24 1635 Oral     SpO2 03/20/24 1635 97 %     Weight 03/20/24 1634 220 lb (99.8 kg)     Height --      Head Circumference --      Peak Flow --      Pain Score 03/20/24 1633 5     Pain Loc --      Pain Education --      Exclude from Growth Chart --    No data found.  Updated Vital Signs BP (!) 157/92 (BP Location: Right Arm)   Pulse 86   Temp 99.1 F (37.3 C) (Oral)   Resp 17   Wt 220 lb (99.8 kg)   SpO2 97%   BMI 37.76 kg/m   Visual Acuity Right Eye Distance:   Left Eye Distance:   Bilateral Distance:    Right Eye Near:   Left Eye Near:    Bilateral Near:     Physical Exam Constitutional:      Appearance: Normal appearance.  HENT:     Right Ear: Tympanic membrane, ear canal and external ear normal.     Left Ear: Tympanic membrane, ear canal and external ear normal.     Nose: Congestion present.     Mouth/Throat:     Pharynx: Posterior oropharyngeal erythema present. No oropharyngeal exudate.  Pulmonary:     Effort: Pulmonary effort is normal.     Breath sounds: Normal breath sounds.  Musculoskeletal:     Cervical back: Normal range of motion.  Lymphadenopathy:     Cervical: Cervical adenopathy present.  Neurological:     Mental Status: She is alert and oriented to person, place, and time. Mental  status is at baseline.      UC Treatments / Results  Labs (all labs ordered are listed, but only abnormal results are displayed) Labs Reviewed -  No data to display  EKG   Radiology No results found.  Procedures Procedures (including critical care time)  Medications Ordered in UC Medications - No data to display  Initial Impression / Assessment and Plan / UC Course  I have reviewed the triage vital signs and the nursing notes.  Pertinent labs & imaging results that were available during my care of the patient were reviewed by me and considered in my medical decision making (see chart for details).  Acute URI  Patient is in no signs of distress nor toxic appearing.  Vital signs are stable.  Low suspicion for pneumonia, pneumothorax or bronchitis and therefore will defer imaging.  Viral testing deferred due to timeline, symptoms present for 7 days without signs of improvement empirically placed on Augmentin  and additionally prescribed Tessalon .May use additional over-the-counter medications as needed for supportive care.  May follow-up with urgent care as needed if symptoms persist or worsen.  Final Clinical Impressions(s) / UC Diagnoses   Final diagnoses:  None   Discharge Instructions   None    ED Prescriptions   None    PDMP not reviewed this encounter.     [1]  Social History Tobacco Use   Smoking status: Never   Smokeless tobacco: Never  Vaping Use   Vaping status: Never Used  Substance Use Topics   Alcohol use: Yes    Comment: rare   Drug use: No     Teresa Shelba SAUNDERS, NP 03/20/24 1701  "

## 2024-03-20 NOTE — Discharge Instructions (Addendum)
 Begin Augmentin  twice daily for 7 days for treatment of bacteria most likely causing symptoms to linger at this time  You may use Tessalon  pill taking every 8 hours as needed in addition to over-the-counter cough medicine    You can take Tylenol  and/or Ibuprofen as needed for fever reduction and pain relief.   For cough: honey 1/2 to 1 teaspoon (you can dilute the honey in water or another fluid).  You can also use guaifenesin and dextromethorphan for cough. You can use a humidifier for chest congestion and cough.  If you don't have a humidifier, you can sit in the bathroom with the hot shower running.      For sore throat: try warm salt water gargles, cepacol lozenges, throat spray, warm tea or water with lemon/honey, popsicles or ice, or OTC cold relief medicine for throat discomfort.   For congestion: take a daily anti-histamine like Zyrtec, Claritin, and a oral decongestant, such as pseudoephedrine.  You can also use Flonase 1-2 sprays in each nostril daily.   It is important to stay hydrated: drink plenty of fluids (water, gatorade/powerade/pedialyte, juices, or teas) to keep your throat moisturized and help further relieve irritation/discomfort.

## 2024-03-20 NOTE — ED Triage Notes (Signed)
 Sx x 1 week  Feverish with no fever Productive cough Bilateral ear fullness
